# Patient Record
Sex: Male | Born: 1945
Health system: Southern US, Community
[De-identification: ages and names within clinical notes are randomized; demographics above are authoritative.]

## PROBLEM LIST (undated history)

## (undated) DIAGNOSIS — E785 Hyperlipidemia, unspecified: Secondary | ICD-10-CM

## (undated) DIAGNOSIS — I2510 Atherosclerotic heart disease of native coronary artery without angina pectoris: Secondary | ICD-10-CM

## (undated) HISTORY — PX: HERNIA REPAIR: SHX51

## (undated) HISTORY — PX: CARDIAC SURGERY: SHX584

---

## 2001-08-22 HISTORY — PX: CORONARY ANGIOPLASTY WITH STENT PLACEMENT: SHX49

## 2003-09-12 ENCOUNTER — Other Ambulatory Visit: Payer: Self-pay

## 2006-01-30 ENCOUNTER — Emergency Department: Payer: Self-pay | Admitting: Emergency Medicine

## 2006-12-22 ENCOUNTER — Ambulatory Visit: Payer: Self-pay | Admitting: Gastroenterology

## 2009-05-15 ENCOUNTER — Emergency Department: Payer: Self-pay | Admitting: Emergency Medicine

## 2009-05-26 ENCOUNTER — Inpatient Hospital Stay: Payer: Self-pay | Admitting: Cardiology

## 2009-06-11 ENCOUNTER — Ambulatory Visit: Payer: Self-pay | Admitting: Cardiology

## 2009-06-25 ENCOUNTER — Encounter: Payer: Self-pay | Admitting: Cardiology

## 2009-07-22 ENCOUNTER — Encounter: Payer: Self-pay | Admitting: Cardiology

## 2009-08-22 ENCOUNTER — Encounter: Payer: Self-pay | Admitting: Cardiology

## 2010-06-14 ENCOUNTER — Emergency Department: Payer: Self-pay | Admitting: Unknown Physician Specialty

## 2010-07-06 ENCOUNTER — Ambulatory Visit: Payer: Self-pay | Admitting: Cardiology

## 2014-12-09 DIAGNOSIS — R079 Chest pain, unspecified: Secondary | ICD-10-CM | POA: Diagnosis not present

## 2014-12-11 DIAGNOSIS — I2510 Atherosclerotic heart disease of native coronary artery without angina pectoris: Secondary | ICD-10-CM | POA: Diagnosis not present

## 2014-12-11 DIAGNOSIS — I2102 ST elevation (STEMI) myocardial infarction involving left anterior descending coronary artery: Secondary | ICD-10-CM | POA: Diagnosis not present

## 2014-12-11 DIAGNOSIS — R079 Chest pain, unspecified: Secondary | ICD-10-CM | POA: Diagnosis not present

## 2014-12-11 DIAGNOSIS — I10 Essential (primary) hypertension: Secondary | ICD-10-CM | POA: Diagnosis not present

## 2014-12-11 DIAGNOSIS — E780 Pure hypercholesterolemia: Secondary | ICD-10-CM | POA: Diagnosis not present

## 2014-12-11 DIAGNOSIS — Z01818 Encounter for other preprocedural examination: Secondary | ICD-10-CM | POA: Diagnosis not present

## 2014-12-16 ENCOUNTER — Ambulatory Visit
Admit: 2014-12-16 | Disposition: A | Discharge status: Home / Self Care | Payer: Self-pay | Attending: Cardiology | Admitting: Cardiology

## 2014-12-16 DIAGNOSIS — I200 Unstable angina: Secondary | ICD-10-CM | POA: Diagnosis not present

## 2014-12-16 DIAGNOSIS — Z7982 Long term (current) use of aspirin: Secondary | ICD-10-CM | POA: Diagnosis not present

## 2014-12-16 DIAGNOSIS — E785 Hyperlipidemia, unspecified: Secondary | ICD-10-CM | POA: Diagnosis not present

## 2014-12-16 DIAGNOSIS — I25119 Atherosclerotic heart disease of native coronary artery with unspecified angina pectoris: Secondary | ICD-10-CM | POA: Diagnosis not present

## 2014-12-16 DIAGNOSIS — Z79899 Other long term (current) drug therapy: Secondary | ICD-10-CM | POA: Diagnosis not present

## 2014-12-16 DIAGNOSIS — Z8489 Family history of other specified conditions: Secondary | ICD-10-CM | POA: Diagnosis not present

## 2014-12-16 DIAGNOSIS — I25110 Atherosclerotic heart disease of native coronary artery with unstable angina pectoris: Secondary | ICD-10-CM | POA: Diagnosis not present

## 2014-12-16 DIAGNOSIS — R0602 Shortness of breath: Secondary | ICD-10-CM | POA: Diagnosis not present

## 2014-12-16 DIAGNOSIS — I10 Essential (primary) hypertension: Secondary | ICD-10-CM | POA: Diagnosis not present

## 2014-12-16 DIAGNOSIS — I252 Old myocardial infarction: Secondary | ICD-10-CM | POA: Diagnosis not present

## 2014-12-16 DIAGNOSIS — Z87442 Personal history of urinary calculi: Secondary | ICD-10-CM | POA: Diagnosis not present

## 2014-12-16 LAB — CK-MB: CK-MB: 11.6 ng/mL — ABNORMAL HIGH

## 2014-12-17 DIAGNOSIS — E785 Hyperlipidemia, unspecified: Secondary | ICD-10-CM | POA: Diagnosis not present

## 2014-12-17 DIAGNOSIS — I252 Old myocardial infarction: Secondary | ICD-10-CM | POA: Diagnosis not present

## 2014-12-17 DIAGNOSIS — R0602 Shortness of breath: Secondary | ICD-10-CM | POA: Diagnosis not present

## 2014-12-17 DIAGNOSIS — Z7982 Long term (current) use of aspirin: Secondary | ICD-10-CM | POA: Diagnosis not present

## 2014-12-17 DIAGNOSIS — I25119 Atherosclerotic heart disease of native coronary artery with unspecified angina pectoris: Secondary | ICD-10-CM | POA: Diagnosis not present

## 2014-12-17 DIAGNOSIS — Z8489 Family history of other specified conditions: Secondary | ICD-10-CM | POA: Diagnosis not present

## 2014-12-17 DIAGNOSIS — I10 Essential (primary) hypertension: Secondary | ICD-10-CM | POA: Diagnosis not present

## 2014-12-17 DIAGNOSIS — Z79899 Other long term (current) drug therapy: Secondary | ICD-10-CM | POA: Diagnosis not present

## 2014-12-17 DIAGNOSIS — Z87442 Personal history of urinary calculi: Secondary | ICD-10-CM | POA: Diagnosis not present

## 2014-12-17 LAB — TROPONIN I
TROPONIN-I: 1.3 ng/mL — AB
Troponin-I: 0.91 ng/mL — ABNORMAL HIGH

## 2014-12-17 LAB — CK: CK, Total: 192 U/L

## 2014-12-17 LAB — BASIC METABOLIC PANEL
Anion Gap: 6 — ABNORMAL LOW (ref 7–16)
BUN: 20 mg/dL
CALCIUM: 9 mg/dL
CHLORIDE: 107 mmol/L
Co2: 25 mmol/L
Creatinine: 1.22 mg/dL
EGFR (African American): >60
EGFR (Non-African Amer.): 60 — ABNORMAL LOW
GLUCOSE: 138 mg/dL — AB
Potassium: 4 mmol/L
SODIUM: 138 mmol/L

## 2014-12-17 LAB — CK-MB
CK-MB: 11.7 ng/mL — ABNORMAL HIGH
CK-MB: 6.7 ng/mL — ABNORMAL HIGH

## 2014-12-21 NOTE — Discharge Summary (Addendum)
PATIENT NAME:  James Preston, HANDLEY MR#:  [id number] DATE OF BIRTH:  03/22/1946  DATE OF ADMISSION:  12/16/2014 DATE OF DISCHARGE:  12/16/2014  PRIMARY CARE PHYSICIAN:  Derinda Late, MD  FINAL DIAGNOSES:  1. Coronary artery disease.  2. Hypertension.   DISCHARGE MEDICATIONS: Aspirin 81 mg daily, Effient 10 mg daily, metoprolol succinate 50 mg daily, lisinopril tablet 5 mg daily, simvastatin 20 mg daily.   PROCEDURE:  Cardiac catheterization with selective coronary arteriography and percutaneous coronary intervention on 12/16/2014.   HOSPITAL COURSE: The patient underwent elective coronary arteriography on 12/16/2014. Coronary arteriography revealed patent stent proximal LAD, patent stent mid RCA and a high-grade 95% stenosis in mid left circumflex. The patient underwent percutaneous coronary intervention, receiving a 3.5 x 12 mm drug-eluting stent with an excellent angiographic result. The patient was returned to telemetry where he had an uncomplicated hospital course  without chest pain. Postprocedural CPK-MB was 11.6 in the absence of chest pain. The patient has been ambulating without difficulty. Repeat CPK-MB, and troponin is pending. If repeat CPK-MB  is stable or lower and troponin is negative, then we will likely discharge home later today.    ____________________________ Isaias Cowman, MD apt D: 12/17/2014 09:27:34 ET T: 12/17/2014 12:02:09 ET JOB#: [id number]  cc: Isaias Cowman, MD, <Dictator> Isaias Cowman MD ELECTRONICALLY SIGNED 12/20/2014 11:11

## 2014-12-25 DIAGNOSIS — I2510 Atherosclerotic heart disease of native coronary artery without angina pectoris: Secondary | ICD-10-CM | POA: Diagnosis not present

## 2014-12-25 DIAGNOSIS — E780 Pure hypercholesterolemia: Secondary | ICD-10-CM | POA: Diagnosis not present

## 2014-12-25 DIAGNOSIS — I2102 ST elevation (STEMI) myocardial infarction involving left anterior descending coronary artery: Secondary | ICD-10-CM | POA: Diagnosis not present

## 2014-12-25 DIAGNOSIS — I10 Essential (primary) hypertension: Secondary | ICD-10-CM | POA: Diagnosis not present

## 2015-03-31 DIAGNOSIS — E780 Pure hypercholesterolemia: Secondary | ICD-10-CM | POA: Diagnosis not present

## 2015-03-31 DIAGNOSIS — I2102 ST elevation (STEMI) myocardial infarction involving left anterior descending coronary artery: Secondary | ICD-10-CM | POA: Diagnosis not present

## 2015-03-31 DIAGNOSIS — I10 Essential (primary) hypertension: Secondary | ICD-10-CM | POA: Diagnosis not present

## 2015-03-31 DIAGNOSIS — I2510 Atherosclerotic heart disease of native coronary artery without angina pectoris: Secondary | ICD-10-CM | POA: Diagnosis not present

## 2015-06-17 DIAGNOSIS — Z23 Encounter for immunization: Secondary | ICD-10-CM | POA: Diagnosis not present

## 2015-09-11 DIAGNOSIS — K921 Melena: Secondary | ICD-10-CM | POA: Diagnosis not present

## 2015-09-11 DIAGNOSIS — Z79899 Other long term (current) drug therapy: Secondary | ICD-10-CM | POA: Diagnosis not present

## 2015-09-11 DIAGNOSIS — E7800 Pure hypercholesterolemia, unspecified: Secondary | ICD-10-CM | POA: Diagnosis not present

## 2015-09-17 DIAGNOSIS — Z1211 Encounter for screening for malignant neoplasm of colon: Secondary | ICD-10-CM | POA: Diagnosis not present

## 2015-09-17 DIAGNOSIS — Z79899 Other long term (current) drug therapy: Secondary | ICD-10-CM | POA: Diagnosis not present

## 2015-09-17 DIAGNOSIS — Z0000 Encounter for general adult medical examination without abnormal findings: Secondary | ICD-10-CM | POA: Diagnosis not present

## 2015-09-17 DIAGNOSIS — E7800 Pure hypercholesterolemia, unspecified: Secondary | ICD-10-CM | POA: Diagnosis not present

## 2015-10-15 DIAGNOSIS — E7800 Pure hypercholesterolemia, unspecified: Secondary | ICD-10-CM | POA: Diagnosis not present

## 2015-10-15 DIAGNOSIS — I2510 Atherosclerotic heart disease of native coronary artery without angina pectoris: Secondary | ICD-10-CM | POA: Diagnosis not present

## 2015-10-15 DIAGNOSIS — I214 Non-ST elevation (NSTEMI) myocardial infarction: Secondary | ICD-10-CM | POA: Diagnosis not present

## 2015-10-15 DIAGNOSIS — I10 Essential (primary) hypertension: Secondary | ICD-10-CM | POA: Diagnosis not present

## 2015-10-16 DIAGNOSIS — Z1211 Encounter for screening for malignant neoplasm of colon: Secondary | ICD-10-CM | POA: Diagnosis not present

## 2016-04-14 DIAGNOSIS — I2510 Atherosclerotic heart disease of native coronary artery without angina pectoris: Secondary | ICD-10-CM | POA: Diagnosis not present

## 2016-04-14 DIAGNOSIS — I10 Essential (primary) hypertension: Secondary | ICD-10-CM | POA: Diagnosis not present

## 2016-04-14 DIAGNOSIS — I2109 ST elevation (STEMI) myocardial infarction involving other coronary artery of anterior wall: Secondary | ICD-10-CM | POA: Diagnosis not present

## 2016-04-14 DIAGNOSIS — E7800 Pure hypercholesterolemia, unspecified: Secondary | ICD-10-CM | POA: Diagnosis not present

## 2016-06-01 DIAGNOSIS — Z23 Encounter for immunization: Secondary | ICD-10-CM | POA: Diagnosis not present

## 2016-06-20 DIAGNOSIS — G44309 Post-traumatic headache, unspecified, not intractable: Secondary | ICD-10-CM | POA: Diagnosis not present

## 2016-06-20 DIAGNOSIS — H532 Diplopia: Secondary | ICD-10-CM | POA: Diagnosis not present

## 2016-06-23 ENCOUNTER — Other Ambulatory Visit: Payer: Self-pay | Admitting: Ophthalmology

## 2016-06-23 DIAGNOSIS — H4922 Sixth [abducent] nerve palsy, left eye: Secondary | ICD-10-CM | POA: Diagnosis not present

## 2016-06-23 DIAGNOSIS — H4920 Sixth [abducent] nerve palsy, unspecified eye: Secondary | ICD-10-CM

## 2016-06-23 DIAGNOSIS — H532 Diplopia: Secondary | ICD-10-CM

## 2016-06-24 ENCOUNTER — Ambulatory Visit
Admission: RE | Admit: 2016-06-24 | Discharge: 2016-06-24 | Disposition: A | Discharge status: Home / Self Care | Payer: Commercial Managed Care - HMO | Source: Ambulatory Visit | Attending: Ophthalmology | Admitting: Ophthalmology

## 2016-06-24 DIAGNOSIS — H4920 Sixth [abducent] nerve palsy, unspecified eye: Secondary | ICD-10-CM | POA: Insufficient documentation

## 2016-06-24 DIAGNOSIS — H532 Diplopia: Secondary | ICD-10-CM

## 2016-06-24 LAB — POCT I-STAT CREATININE: Creatinine, Ser: 1.4 mg/dL — ABNORMAL HIGH (ref 0.61–1.24)

## 2016-06-24 MED ORDER — GADOBENATE DIMEGLUMINE 529 MG/ML IV SOLN
20.0000 mL | Freq: Once | INTRAVENOUS | Status: AC | PRN
  Administered 2016-06-24: 18 mL via INTRAVENOUS

## 2016-06-27 ENCOUNTER — Other Ambulatory Visit
Admit: 2016-06-27 | Discharge: 2016-06-27 | Disposition: A | Discharge status: Home / Self Care | Payer: Commercial Managed Care - HMO | Attending: Ophthalmology | Admitting: Ophthalmology

## 2016-06-27 DIAGNOSIS — H4922 Sixth [abducent] nerve palsy, left eye: Secondary | ICD-10-CM | POA: Insufficient documentation

## 2016-06-27 LAB — PLATELET COUNT: Platelets: 152 10*3/uL (ref 150–440)

## 2016-06-27 LAB — SEDIMENTATION RATE: Sed Rate: 9 mm/hr (ref 0–20)

## 2016-06-28 LAB — HIGH SENSITIVITY CRP: CRP, High Sensitivity: 1.74 mg/L (ref 0.00–3.00)

## 2016-07-01 LAB — MISC LABCORP TEST (SEND OUT): Labcorp test code: 85933

## 2016-09-13 DIAGNOSIS — Z125 Encounter for screening for malignant neoplasm of prostate: Secondary | ICD-10-CM | POA: Diagnosis not present

## 2016-09-13 DIAGNOSIS — E7800 Pure hypercholesterolemia, unspecified: Secondary | ICD-10-CM | POA: Diagnosis not present

## 2016-09-13 DIAGNOSIS — Z79899 Other long term (current) drug therapy: Secondary | ICD-10-CM | POA: Diagnosis not present

## 2016-09-19 DIAGNOSIS — Z0000 Encounter for general adult medical examination without abnormal findings: Secondary | ICD-10-CM | POA: Diagnosis not present

## 2016-10-04 DIAGNOSIS — I2510 Atherosclerotic heart disease of native coronary artery without angina pectoris: Secondary | ICD-10-CM | POA: Diagnosis not present

## 2016-10-04 DIAGNOSIS — I10 Essential (primary) hypertension: Secondary | ICD-10-CM | POA: Diagnosis not present

## 2016-10-04 DIAGNOSIS — I2109 ST elevation (STEMI) myocardial infarction involving other coronary artery of anterior wall: Secondary | ICD-10-CM | POA: Diagnosis not present

## 2016-10-04 DIAGNOSIS — E7800 Pure hypercholesterolemia, unspecified: Secondary | ICD-10-CM | POA: Diagnosis not present

## 2016-10-27 DIAGNOSIS — I2510 Atherosclerotic heart disease of native coronary artery without angina pectoris: Secondary | ICD-10-CM | POA: Diagnosis not present

## 2016-10-27 DIAGNOSIS — I2109 ST elevation (STEMI) myocardial infarction involving other coronary artery of anterior wall: Secondary | ICD-10-CM | POA: Diagnosis not present

## 2016-11-01 DIAGNOSIS — I2510 Atherosclerotic heart disease of native coronary artery without angina pectoris: Secondary | ICD-10-CM | POA: Diagnosis not present

## 2016-11-01 DIAGNOSIS — I2109 ST elevation (STEMI) myocardial infarction involving other coronary artery of anterior wall: Secondary | ICD-10-CM | POA: Diagnosis not present

## 2016-11-01 DIAGNOSIS — I10 Essential (primary) hypertension: Secondary | ICD-10-CM | POA: Diagnosis not present

## 2016-11-01 DIAGNOSIS — E7800 Pure hypercholesterolemia, unspecified: Secondary | ICD-10-CM | POA: Diagnosis not present

## 2017-05-02 DIAGNOSIS — I2510 Atherosclerotic heart disease of native coronary artery without angina pectoris: Secondary | ICD-10-CM | POA: Diagnosis not present

## 2017-05-02 DIAGNOSIS — I10 Essential (primary) hypertension: Secondary | ICD-10-CM | POA: Diagnosis not present

## 2017-05-02 DIAGNOSIS — E785 Hyperlipidemia, unspecified: Secondary | ICD-10-CM | POA: Diagnosis not present

## 2017-05-31 DIAGNOSIS — Z23 Encounter for immunization: Secondary | ICD-10-CM | POA: Diagnosis not present

## 2017-06-16 DIAGNOSIS — M25551 Pain in right hip: Secondary | ICD-10-CM | POA: Diagnosis not present

## 2017-10-24 DIAGNOSIS — Z79899 Other long term (current) drug therapy: Secondary | ICD-10-CM | POA: Diagnosis not present

## 2017-10-24 DIAGNOSIS — Z125 Encounter for screening for malignant neoplasm of prostate: Secondary | ICD-10-CM | POA: Diagnosis not present

## 2017-10-24 DIAGNOSIS — E7800 Pure hypercholesterolemia, unspecified: Secondary | ICD-10-CM | POA: Diagnosis not present

## 2017-10-31 DIAGNOSIS — I2109 ST elevation (STEMI) myocardial infarction involving other coronary artery of anterior wall: Secondary | ICD-10-CM | POA: Diagnosis not present

## 2017-10-31 DIAGNOSIS — I2510 Atherosclerotic heart disease of native coronary artery without angina pectoris: Secondary | ICD-10-CM | POA: Diagnosis not present

## 2017-10-31 DIAGNOSIS — I10 Essential (primary) hypertension: Secondary | ICD-10-CM | POA: Diagnosis not present

## 2017-10-31 DIAGNOSIS — E785 Hyperlipidemia, unspecified: Secondary | ICD-10-CM | POA: Diagnosis not present

## 2017-11-01 DIAGNOSIS — H524 Presbyopia: Secondary | ICD-10-CM | POA: Diagnosis not present

## 2017-11-01 DIAGNOSIS — Z0100 Encounter for examination of eyes and vision without abnormal findings: Secondary | ICD-10-CM | POA: Diagnosis not present

## 2017-11-02 DIAGNOSIS — Z0000 Encounter for general adult medical examination without abnormal findings: Secondary | ICD-10-CM | POA: Diagnosis not present

## 2018-05-07 DIAGNOSIS — R739 Hyperglycemia, unspecified: Secondary | ICD-10-CM | POA: Diagnosis not present

## 2018-05-07 DIAGNOSIS — E7800 Pure hypercholesterolemia, unspecified: Secondary | ICD-10-CM | POA: Diagnosis not present

## 2018-05-07 DIAGNOSIS — Z79899 Other long term (current) drug therapy: Secondary | ICD-10-CM | POA: Diagnosis not present

## 2018-05-08 DIAGNOSIS — I2510 Atherosclerotic heart disease of native coronary artery without angina pectoris: Secondary | ICD-10-CM | POA: Diagnosis not present

## 2018-05-08 DIAGNOSIS — I2109 ST elevation (STEMI) myocardial infarction involving other coronary artery of anterior wall: Secondary | ICD-10-CM | POA: Diagnosis not present

## 2018-05-08 DIAGNOSIS — E785 Hyperlipidemia, unspecified: Secondary | ICD-10-CM | POA: Diagnosis not present

## 2018-05-08 DIAGNOSIS — I10 Essential (primary) hypertension: Secondary | ICD-10-CM | POA: Diagnosis not present

## 2018-05-14 DIAGNOSIS — E119 Type 2 diabetes mellitus without complications: Secondary | ICD-10-CM | POA: Diagnosis not present

## 2018-05-14 DIAGNOSIS — Z125 Encounter for screening for malignant neoplasm of prostate: Secondary | ICD-10-CM | POA: Diagnosis not present

## 2018-05-14 DIAGNOSIS — E7800 Pure hypercholesterolemia, unspecified: Secondary | ICD-10-CM | POA: Diagnosis not present

## 2018-05-14 DIAGNOSIS — I2510 Atherosclerotic heart disease of native coronary artery without angina pectoris: Secondary | ICD-10-CM | POA: Diagnosis not present

## 2018-05-14 DIAGNOSIS — Z79899 Other long term (current) drug therapy: Secondary | ICD-10-CM | POA: Diagnosis not present

## 2018-05-14 DIAGNOSIS — I10 Essential (primary) hypertension: Secondary | ICD-10-CM | POA: Diagnosis not present

## 2018-05-30 DIAGNOSIS — Z23 Encounter for immunization: Secondary | ICD-10-CM | POA: Diagnosis not present

## 2019-03-22 ENCOUNTER — Other Ambulatory Visit: Payer: Commercial Managed Care - HMO | Attending: Internal Medicine

## 2019-03-27 ENCOUNTER — Ambulatory Visit: Admit: 2019-03-27 | Payer: Commercial Managed Care - HMO | Admitting: Internal Medicine

## 2019-03-27 SURGERY — COLONOSCOPY WITH PROPOFOL
Anesthesia: General

## 2019-06-11 ENCOUNTER — Other Ambulatory Visit: Payer: Self-pay | Admitting: *Deleted

## 2019-06-11 DIAGNOSIS — Z20822 Contact with and (suspected) exposure to covid-19: Secondary | ICD-10-CM

## 2019-06-12 LAB — NOVEL CORONAVIRUS, NAA: SARS-CoV-2, NAA: NOT DETECTED

## 2020-04-20 DIAGNOSIS — M5416 Radiculopathy, lumbar region: Secondary | ICD-10-CM | POA: Diagnosis not present

## 2020-04-20 DIAGNOSIS — M5126 Other intervertebral disc displacement, lumbar region: Secondary | ICD-10-CM | POA: Diagnosis not present

## 2020-06-25 DIAGNOSIS — E1122 Type 2 diabetes mellitus with diabetic chronic kidney disease: Secondary | ICD-10-CM | POA: Diagnosis not present

## 2020-06-25 DIAGNOSIS — E7800 Pure hypercholesterolemia, unspecified: Secondary | ICD-10-CM | POA: Diagnosis not present

## 2020-06-25 DIAGNOSIS — N1831 Chronic kidney disease, stage 3a: Secondary | ICD-10-CM | POA: Diagnosis not present

## 2020-06-25 DIAGNOSIS — Z79899 Other long term (current) drug therapy: Secondary | ICD-10-CM | POA: Diagnosis not present

## 2020-07-02 DIAGNOSIS — Z125 Encounter for screening for malignant neoplasm of prostate: Secondary | ICD-10-CM | POA: Diagnosis not present

## 2020-07-02 DIAGNOSIS — E1122 Type 2 diabetes mellitus with diabetic chronic kidney disease: Secondary | ICD-10-CM | POA: Diagnosis not present

## 2020-07-02 DIAGNOSIS — E785 Hyperlipidemia, unspecified: Secondary | ICD-10-CM | POA: Diagnosis not present

## 2020-07-02 DIAGNOSIS — Z23 Encounter for immunization: Secondary | ICD-10-CM | POA: Diagnosis not present

## 2020-07-02 DIAGNOSIS — N183 Chronic kidney disease, stage 3 unspecified: Secondary | ICD-10-CM | POA: Diagnosis not present

## 2020-07-02 DIAGNOSIS — Z79899 Other long term (current) drug therapy: Secondary | ICD-10-CM | POA: Diagnosis not present

## 2020-07-02 DIAGNOSIS — I2510 Atherosclerotic heart disease of native coronary artery without angina pectoris: Secondary | ICD-10-CM | POA: Diagnosis not present

## 2020-07-02 DIAGNOSIS — I129 Hypertensive chronic kidney disease with stage 1 through stage 4 chronic kidney disease, or unspecified chronic kidney disease: Secondary | ICD-10-CM | POA: Diagnosis not present

## 2020-08-18 DIAGNOSIS — I10 Essential (primary) hypertension: Secondary | ICD-10-CM | POA: Diagnosis not present

## 2020-08-18 DIAGNOSIS — E7800 Pure hypercholesterolemia, unspecified: Secondary | ICD-10-CM | POA: Diagnosis not present

## 2020-08-18 DIAGNOSIS — I2510 Atherosclerotic heart disease of native coronary artery without angina pectoris: Secondary | ICD-10-CM | POA: Diagnosis not present

## 2020-12-28 DIAGNOSIS — Z125 Encounter for screening for malignant neoplasm of prostate: Secondary | ICD-10-CM | POA: Diagnosis not present

## 2020-12-28 DIAGNOSIS — E1122 Type 2 diabetes mellitus with diabetic chronic kidney disease: Secondary | ICD-10-CM | POA: Diagnosis not present

## 2020-12-28 DIAGNOSIS — Z79899 Other long term (current) drug therapy: Secondary | ICD-10-CM | POA: Diagnosis not present

## 2020-12-28 DIAGNOSIS — N1831 Chronic kidney disease, stage 3a: Secondary | ICD-10-CM | POA: Diagnosis not present

## 2020-12-28 DIAGNOSIS — E7800 Pure hypercholesterolemia, unspecified: Secondary | ICD-10-CM | POA: Diagnosis not present

## 2021-01-04 DIAGNOSIS — Z0000 Encounter for general adult medical examination without abnormal findings: Secondary | ICD-10-CM | POA: Diagnosis not present

## 2021-01-04 DIAGNOSIS — N183 Chronic kidney disease, stage 3 unspecified: Secondary | ICD-10-CM | POA: Diagnosis not present

## 2021-01-04 DIAGNOSIS — Z1211 Encounter for screening for malignant neoplasm of colon: Secondary | ICD-10-CM | POA: Diagnosis not present

## 2021-01-04 DIAGNOSIS — Z1331 Encounter for screening for depression: Secondary | ICD-10-CM | POA: Diagnosis not present

## 2021-01-04 DIAGNOSIS — Z79899 Other long term (current) drug therapy: Secondary | ICD-10-CM | POA: Diagnosis not present

## 2021-01-28 DIAGNOSIS — Z1211 Encounter for screening for malignant neoplasm of colon: Secondary | ICD-10-CM | POA: Diagnosis not present

## 2021-02-01 ENCOUNTER — Other Ambulatory Visit (HOSPITAL_COMMUNITY): Payer: Self-pay

## 2021-02-17 DIAGNOSIS — I2510 Atherosclerotic heart disease of native coronary artery without angina pectoris: Secondary | ICD-10-CM | POA: Diagnosis not present

## 2021-02-17 DIAGNOSIS — E7800 Pure hypercholesterolemia, unspecified: Secondary | ICD-10-CM | POA: Diagnosis not present

## 2021-02-17 DIAGNOSIS — I10 Essential (primary) hypertension: Secondary | ICD-10-CM | POA: Diagnosis not present

## 2021-02-17 DIAGNOSIS — E1122 Type 2 diabetes mellitus with diabetic chronic kidney disease: Secondary | ICD-10-CM | POA: Diagnosis not present

## 2021-02-17 DIAGNOSIS — N1831 Chronic kidney disease, stage 3a: Secondary | ICD-10-CM | POA: Diagnosis not present

## 2021-03-01 DIAGNOSIS — H524 Presbyopia: Secondary | ICD-10-CM | POA: Diagnosis not present

## 2021-03-23 DIAGNOSIS — H25813 Combined forms of age-related cataract, bilateral: Secondary | ICD-10-CM | POA: Diagnosis not present

## 2021-03-23 DIAGNOSIS — H02834 Dermatochalasis of left upper eyelid: Secondary | ICD-10-CM | POA: Diagnosis not present

## 2021-03-23 DIAGNOSIS — H02831 Dermatochalasis of right upper eyelid: Secondary | ICD-10-CM | POA: Diagnosis not present

## 2021-03-26 DIAGNOSIS — R195 Other fecal abnormalities: Secondary | ICD-10-CM | POA: Diagnosis not present

## 2021-03-26 DIAGNOSIS — R194 Change in bowel habit: Secondary | ICD-10-CM | POA: Diagnosis not present

## 2021-03-29 ENCOUNTER — Emergency Department: Payer: Medicare HMO

## 2021-03-29 ENCOUNTER — Observation Stay
Admission: EM | Admit: 2021-03-29 | Discharge: 2021-03-31 | Disposition: A | Discharge status: Home / Self Care | Payer: Medicare HMO | Attending: Internal Medicine | Admitting: Internal Medicine

## 2021-03-29 DIAGNOSIS — N1831 Chronic kidney disease, stage 3a: Secondary | ICD-10-CM

## 2021-03-29 DIAGNOSIS — R001 Bradycardia, unspecified: Secondary | ICD-10-CM | POA: Diagnosis not present

## 2021-03-29 DIAGNOSIS — R531 Weakness: Secondary | ICD-10-CM | POA: Diagnosis present

## 2021-03-29 DIAGNOSIS — G459 Transient cerebral ischemic attack, unspecified: Secondary | ICD-10-CM | POA: Diagnosis not present

## 2021-03-29 DIAGNOSIS — Z7982 Long term (current) use of aspirin: Secondary | ICD-10-CM | POA: Insufficient documentation

## 2021-03-29 DIAGNOSIS — R0689 Other abnormalities of breathing: Secondary | ICD-10-CM | POA: Diagnosis not present

## 2021-03-29 DIAGNOSIS — R7303 Prediabetes: Secondary | ICD-10-CM

## 2021-03-29 DIAGNOSIS — Z79899 Other long term (current) drug therapy: Secondary | ICD-10-CM | POA: Diagnosis not present

## 2021-03-29 DIAGNOSIS — Z7902 Long term (current) use of antithrombotics/antiplatelets: Secondary | ICD-10-CM | POA: Diagnosis not present

## 2021-03-29 DIAGNOSIS — E785 Hyperlipidemia, unspecified: Secondary | ICD-10-CM | POA: Diagnosis not present

## 2021-03-29 DIAGNOSIS — I447 Left bundle-branch block, unspecified: Secondary | ICD-10-CM | POA: Diagnosis not present

## 2021-03-29 DIAGNOSIS — Z20822 Contact with and (suspected) exposure to covid-19: Secondary | ICD-10-CM | POA: Diagnosis not present

## 2021-03-29 DIAGNOSIS — R4781 Slurred speech: Secondary | ICD-10-CM | POA: Diagnosis not present

## 2021-03-29 DIAGNOSIS — N183 Chronic kidney disease, stage 3 unspecified: Secondary | ICD-10-CM | POA: Diagnosis present

## 2021-03-29 DIAGNOSIS — R2689 Other abnormalities of gait and mobility: Secondary | ICD-10-CM | POA: Diagnosis not present

## 2021-03-29 DIAGNOSIS — R202 Paresthesia of skin: Secondary | ICD-10-CM | POA: Diagnosis not present

## 2021-03-29 DIAGNOSIS — I2510 Atherosclerotic heart disease of native coronary artery without angina pectoris: Secondary | ICD-10-CM

## 2021-03-29 DIAGNOSIS — R0902 Hypoxemia: Secondary | ICD-10-CM | POA: Diagnosis not present

## 2021-03-29 HISTORY — DX: Hyperlipidemia, unspecified: E78.5

## 2021-03-29 HISTORY — DX: Atherosclerotic heart disease of native coronary artery without angina pectoris: I25.10

## 2021-03-29 LAB — RESP PANEL BY RT-PCR (FLU A&B, COVID) ARPGX2
Influenza A by PCR: NEGATIVE
Influenza B by PCR: NEGATIVE
SARS Coronavirus 2 by RT PCR: NEGATIVE

## 2021-03-29 LAB — CBC WITH DIFFERENTIAL/PLATELET
Abs Immature Granulocytes: 0.03 10*3/uL (ref 0.00–0.07)
Basophils Absolute: 0 10*3/uL (ref 0.0–0.1)
Basophils Relative: 0 %
Eosinophils Absolute: 0.3 10*3/uL (ref 0.0–0.5)
Eosinophils Relative: 4 %
HCT: 34.5 % — ABNORMAL LOW (ref 39.0–52.0)
Hemoglobin: 11.9 g/dL — ABNORMAL LOW (ref 13.0–17.0)
Immature Granulocytes: 0 %
Lymphocytes Relative: 23 %
Lymphs Abs: 1.7 10*3/uL (ref 0.7–4.0)
MCH: 30.5 pg (ref 26.0–34.0)
MCHC: 34.5 g/dL (ref 30.0–36.0)
MCV: 88.5 fL (ref 80.0–100.0)
Monocytes Absolute: 0.7 10*3/uL (ref 0.1–1.0)
Monocytes Relative: 10 %
Neutro Abs: 4.7 10*3/uL (ref 1.7–7.7)
Neutrophils Relative %: 63 %
Platelets: 154 10*3/uL (ref 150–400)
RBC: 3.9 MIL/uL — ABNORMAL LOW (ref 4.22–5.81)
RDW: 12.9 % (ref 11.5–15.5)
WBC: 7.5 10*3/uL (ref 4.0–10.5)
nRBC: 0 % (ref 0.0–0.2)

## 2021-03-29 LAB — MAGNESIUM: Magnesium: 2.3 mg/dL (ref 1.7–2.4)

## 2021-03-29 LAB — BASIC METABOLIC PANEL
Anion gap: 4 — ABNORMAL LOW (ref 5–15)
BUN: 36 mg/dL — ABNORMAL HIGH (ref 8–23)
CO2: 26 mmol/L (ref 22–32)
Calcium: 9 mg/dL (ref 8.9–10.3)
Chloride: 105 mmol/L (ref 98–111)
Creatinine, Ser: 1.73 mg/dL — ABNORMAL HIGH (ref 0.61–1.24)
GFR, Estimated: 40 mL/min — ABNORMAL LOW (ref >60)
Glucose, Bld: 106 mg/dL — ABNORMAL HIGH (ref 70–99)
Potassium: 4.2 mmol/L (ref 3.5–5.1)
Sodium: 135 mmol/L (ref 135–145)

## 2021-03-29 LAB — TROPONIN I (HIGH SENSITIVITY): Troponin I (High Sensitivity): 8 ng/L (ref <18)

## 2021-03-29 MED ORDER — STROKE: EARLY STAGES OF RECOVERY BOOK: Freq: Once | Status: AC

## 2021-03-29 MED ORDER — ACETAMINOPHEN 325 MG PO TABS: 650.0000 mg | ORAL_TABLET | Freq: Four times a day (QID) | ORAL | Status: DC | PRN

## 2021-03-29 MED ORDER — ACETAMINOPHEN 650 MG RE SUPP: 650.0000 mg | Freq: Four times a day (QID) | RECTAL | Status: DC | PRN

## 2021-03-29 NOTE — ED Provider Notes (Signed)
[facility] Emergency Department Provider Note   ____________________________________________   I have reviewed the triage vital signs and the nursing notes.   HISTORY  Chief Complaint Numbness   History limited by: Not Limited   HPI James Preston is a 75 y.o. male who presents to the emergency department today because of concern for an episode of left arm weakness/paresthesias. The patient states that at around 6 he noticed that his arm felt like it had gone to sleep. He noticed it when he was having a hard time putting the lid back on a jar. By the time of my exam the patient states that it was feeling better. 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 Denies any head trauma. The patient also states that he noticed some slurred speech which has also resolved. The patient denies similar symptoms in the past.   Records reviewed. Per medical record review patient has a history of CAD with PCI.   Past Medical History:  Diagnosis Date   Coronary artery disease     There are no problems to display for this patient.   Past Surgical History:  Procedure Laterality Date   CARDIAC SURGERY     CORONARY ANGIOPLASTY WITH STENT PLACEMENT  2003   x3    Prior to Admission medications   Not on File    Allergies Patient has no known allergies.  History reviewed. No pertinent family history.  Social History Social History   Tobacco Use   Smoking status: Never   Smokeless tobacco: Never  Substance Use Topics   Alcohol use: Yes    Comment: very rare   Drug use: Never    Review of Systems Constitutional: No fever/chills Eyes: No visual changes. ENT: No sore throat. Cardiovascular: Denies chest pain. Respiratory: Denies shortness of breath. Gastrointestinal: No abdominal pain.  No nausea, no vomiting.  No diarrhea.   Genitourinary: Negative for dysuria. Musculoskeletal: Negative for back  pain. Skin: Negative for rash. Neurological: Positive for slurred speech. Positive for left arm paresthesias.   ____________________________________________   PHYSICAL EXAM:  VITAL SIGNS: ED Triage Vitals  Enc Vitals Group     BP 03/29/21 1839 138/75     Pulse Rate 03/29/21 1839 (!) 52     Resp 03/29/21 1839 13     Temp 03/29/21 1839 98.5 F (36.9 C)     Temp Source 03/29/21 1839 Oral     SpO2 03/29/21 1839 100 %     Weight 03/29/21 1842 185 lb (83.9 kg)     Height 03/29/21 1842 '5\' 7"'$  (1.702 m)     Head Circumference --      Peak Flow --      Pain Score 03/29/21 1841 0   Constitutional: Alert and oriented.  Eyes: Conjunctivae are normal.  ENT      Head: Normocephalic and atraumatic.      Nose: No congestion/rhinnorhea.      Mouth/Throat: Mucous membranes are moist.      Neck: No stridor. Hematological/Lymphatic/Immunilogical: No cervical lymphadenopathy. Cardiovascular: Normal rate, regular rhythm.  No murmurs, rubs, or gallops.  Respiratory: Normal respiratory effort without tachypnea nor retractions. Breath sounds are clear and equal bilaterally. No wheezes/rales/rhonchi. Gastrointestinal: Soft and non tender. No rebound. No guarding.  Genitourinary: Deferred Musculoskeletal: Normal range of motion in all extremities. No lower extremity edema. Neurologic:  Normal speech and language. Face symmetric. PERRL. EOMI. Strength 5/5 in upper and lower extremities.  Sensation intact. No gross focal neurologic deficits are appreciated.  Skin:  Skin is warm, dry and intact. No rash noted. Psychiatric: Mood and affect are normal. Speech and behavior are normal. Patient exhibits appropriate insight and judgment.  ____________________________________________    LABS (pertinent positives/negatives)  CBC wbc 7.5, hgb 11.9, plt 154 Trop hs 8 BMP na 135, k 4.2, glu 106, cr 1.73 ____________________________________________   EKG  I, Nance Pear, attending physician,  personally viewed and interpreted this EKG  EKG Time: 1844 Rate: 52 Rhythm: sinus bradycardia Axis: normal Intervals: qtc 423 QRS: LBBB ST changes: no st elevation Impression: abnormal ekg  ____________________________________________    RADIOLOGY  CT head No acute abnormality  ____________________________________________   PROCEDURES  Procedures  ____________________________________________   INITIAL IMPRESSION / ASSESSMENT AND PLAN / ED COURSE  Pertinent labs & imaging results that were available during my care of the patient were reviewed by me and considered in my medical decision making (see chart for details).   Patient presented to the emergency department today because of concern for left arm going asleep and some slurred speech. At the time of my exam the patient states he feels improved and neuro exam is unremarkable. CT head and blood work without obvious etiology. At this time will plan on admission for TIA work up. Discussed plan with patient.   ____________________________________________   FINAL CLINICAL IMPRESSION(S) / ED DIAGNOSES  Final diagnoses:  TIA (transient ischemic attack)     Note: This dictation was prepared with Dragon dictation. Any transcriptional errors that result from this process are unintentional     Nance Pear, MD 03/30/21 [phone]

## 2021-03-29 NOTE — H&P (Signed)
History and Physical    PLEASE NOTE THAT DRAGON DICTATION SOFTWARE WAS USED IN THE CONSTRUCTION OF THIS NOTE.   James Preston [id number] DOB: 1946-04-29 DOA: 03/29/2021  PCP: Derinda Late, MD Patient coming from: home   I have personally briefly reviewed patient's old medical records in [place]  Chief Complaint: Slurred speech  HPI: James Preston is a 75 y.o. male with medical history significant for coronary artery disease status post PCI with stents x3 in 2003, hyperlipidemia, stage IIIa chronic kidney disease with baseline creatinine 1.5, who is admitted to [facility] on 03/29/2021 with suspected TIA after presenting from home to [facility] ED complaining of slurred speech.   The patient reports sudden onset of slurred speech and left hand weakness associated with numbness at 1730 today.  He reports that these symptoms lasted approximately 1 hour before spontaneously resolving without subsequent recurrence.  Denies any residual acute focal neurologic deficits at this time.  He reports that the above symptoms were not associated with any dysphagia, dizziness, vertigo, nausea, vomiting, acute change in vision, blurry vision, diplopia, facial droop, or headache.  The patient was with his wife at the time of onset of the above symptoms, he was able to confirm the presence of slurred speech at that time.  Denies any associated chest pain, shortness of breath, palpitations, diaphoresis, presyncope, or syncope.  Denies any previous history of stroke. In terms of modifiable risk factors, the patient acknowledges a history of hyperlipidemia, but denies any known underlying history of hypertension, reporting that beta-blocker and ACE inhibitor that he takes as an outpatient is on the basis of a purely cardiac indication stemming from a history of coronary artery disease.  Denies any known history of underlying diabetes, atrial fibrillation, or obstructive sleep  apnea.  Reports that he is a lifelong non-smoker.  Reports good compliance with his outpatient antihypertensive medications as well as simvastatin 40 mg p.o. daily.  He is on a daily baby aspirin, with most recent dose occurring on the morning of 03/29/2021, and denies any use of additional antiplatelet or anticoagulant medications as an outpatient.  He was brought to [facility] ED for further evaluation and management of the above.  Of note, his initial slurred speech, left hand weakness, and left hand numbness all completely resolved and spontaneous fashion prior to being seen by the EDP.   Denies any associated subjective fever, chills, rigors, or generalized myalgias.  No recent neck stiffness, rhinitis, rhinorrhea, sore throat, wheezing, cough, abdominal pain, or rash.  No recent traveling or known COVID-19 exposures.  Denies any recent dysuria, gross hematuria, or change in urinary urgency/frequency.    ED Course:  Vital signs in the ED were notable for the following:  - Tetramex 98.5, heart rate 59, blood pressure 123/75 138/75; respiratory rate 16-20, oxygen saturation 97 to 100% on room air.  Labs were notable for the following: BMP was notable for the following: Sodium 135, potassium 4.2, BUN 36, creatinine 1.73 relative to most recent prior creatinine data point 1.5159 2022.  High-sensitivity troponin I noted to be 8.  CBC notable for white cell count of 7500.  Screening nasopharyngeal COVID-19 PCR was performed in the ED this evening and found to be negative.  Noncontrast CT head showed no evidence of acute intracranial process, including no evidence of intracranial hemorrhage or acute infarct.  While in the ED, the following were administered: none.  Subsequently, the patient was admitted to the med telemetry floor for overnight  observation for further evaluation and management of suspected presenting TIA.     Review of Systems: As per HPI otherwise 10 point review of systems negative.    Past Medical History:  Diagnosis Date   Coronary artery disease    HLD (hyperlipidemia)     Past Surgical History:  Procedure Laterality Date   CARDIAC SURGERY     CORONARY ANGIOPLASTY WITH STENT PLACEMENT  2003   x3    Social History:  reports that he has never smoked. He has never used smokeless tobacco. He reports current alcohol use. He reports that he does not use drugs.   No Known Allergies  History reviewed. No pertinent family history.   Home medications: Daily baby aspirin, lisinopril, Toprol-XL, simvastatin 40 mg p.o. daily.  Objective    Physical Exam: Vitals:   03/29/21 1930 03/29/21 2000 03/29/21 2030 03/29/21 2100  BP: 123/75 129/64 124/67 130/69  Pulse: (!) 51 (!) 48 (!) 53 (!) 47  Resp: (!) '23 20 17 19  '$ Temp:      TempSrc:      SpO2: 98% 98% 98% 99%  Weight:      Height:        General: appears to be stated age; alert, oriented Skin: warm, dry, no rash Head:  AT/Harris Mouth:  Oral mucosa membranes appear moist, normal dentition Neck: supple; trachea midline Heart:  RRR; did not appreciate any M/R/G Lungs: CTAB, did not appreciate any wheezes, rales, or rhonchi Abdomen: + BS; soft, ND, NT Vascular: 2+ pedal pulses b/l; 2+ radial pulses b/l Extremities: no peripheral edema, no muscle wasting Neuro:  5/5 strength of the proximal and distal flexors and extensors of the upper and lower extremities bilaterally; sensation intact in upper and lower extremities b/l; cranial nerves II through XII grossly intact; no pronator drift; no evidence suggestive of slurred speech, dysarthria, or facial droop; Normal muscle tone. No tremors.    Labs on Admission: I have personally reviewed following labs and imaging studies  CBC: Recent Labs  Lab 03/29/21 1845  WBC 7.5  NEUTROABS 4.7  HGB 11.9*  HCT 34.5*  MCV 88.5  PLT 123456   Basic Metabolic Panel: Recent Labs  Lab 03/29/21 1845  NA 135  K 4.2  CL 105  CO2 26  GLUCOSE 106*  BUN 36*  CREATININE  1.73*  CALCIUM 9.0   GFR: Estimated Creatinine Clearance: 37 mL/min (A) (by C-G formula based on SCr of 1.73 mg/dL (H)). Liver Function Tests: No results for input(s): AST, ALT, ALKPHOS, BILITOT, PROT, ALBUMIN in the last 168 hours. No results for input(s): LIPASE, AMYLASE in the last 168 hours. No results for input(s): AMMONIA in the last 168 hours. Coagulation Profile: No results for input(s): INR, PROTIME in the last 168 hours. Cardiac Enzymes: No results for input(s): CKTOTAL, CKMB, CKMBINDEX, TROPONINI in the last 168 hours. BNP (last 3 results) No results for input(s): PROBNP in the last 8760 hours. HbA1C: No results for input(s): HGBA1C in the last 72 hours. CBG: No results for input(s): GLUCAP in the last 168 hours. Lipid Profile: No results for input(s): CHOL, HDL, LDLCALC, TRIG, CHOLHDL, LDLDIRECT in the last 72 hours. Thyroid Function Tests: No results for input(s): TSH, T4TOTAL, FREET4, T3FREE, THYROIDAB in the last 72 hours. Anemia Panel: No results for input(s): VITAMINB12, FOLATE, FERRITIN, TIBC, IRON, RETICCTPCT in the last 72 hours. Urine analysis: No results found for: COLORURINE, APPEARANCEUR, LABSPEC, PHURINE, GLUCOSEU, HGBUR, BILIRUBINUR, KETONESUR, PROTEINUR, UROBILINOGEN, NITRITE, LEUKOCYTESUR  Radiological Exams on Admission:  CT HEAD WO CONTRAST (5MM)  Result Date: 03/29/2021 CLINICAL DATA:  Left arm numbness.  Slurred speech. EXAM: CT HEAD WITHOUT CONTRAST TECHNIQUE: Contiguous axial images were obtained from the base of the skull through the vertex without intravenous contrast. COMPARISON:  Brain MRI 06/24/2016 FINDINGS: Brain: No intracranial hemorrhage, mass effect, or midline shift. No hydrocephalus. The basilar cisterns are patent. No evidence of territorial infarct or acute ischemia. Minimal periventricular chronic small vessel ischemia, similar to prior MRI. No extra-axial or intracranial fluid collection. Vascular: No hyperdense vessel or unexpected  calcification. Skull: Normal. Negative for fracture or focal lesion. Sinuses/Orbits: Minimal opacification of lower right mastoid air cells. Mastoid air cells otherwise clear. Paranasal sinuses are clear. Other: None. IMPRESSION: 1. No acute intracranial abnormality. 2. Minimal opacification of lower right mastoid air cells. Electronically Signed   By: Keith Rake M.D.   On: 03/29/2021 19:54      Assessment/Plan   James Preston is a 75 y.o. male with medical history significant for coronary artery disease status post PCI with stents x3 in 2003, hyperlipidemia, stage IIIa chronic kidney disease with baseline creatinine 1.5, who is admitted to [facility] on 03/29/2021 with suspected TIA after presenting from home to [facility] ED complaining of slurred speech.    Principal Problem:   TIA (transient ischemic attack) Active Problems:   Coronary artery disease   HLD (hyperlipidemia)   CKD (chronic kidney disease) stage 3, GFR 30-59 ml/min (HCC)     #) TIA: suspected dx on the basis of acute onset of slurred speech, left hand weakness, left hand numbness at 1730 on 03/29/2021, with complete ensuing resolution of the symptoms and spontaneous fashion, with no residual acute focal neurologic deficit noted, with CT head showing no evidence of acute intracranial process, including no evidence of acute intracranial hemorrhage or acute infarct.Will further evaluate with MRI brain, imaging of the neck, and echocardiogram with bubble study, as further detailed below.  Additionally, we will further assess for potential modifiable CVA risk factors, as further detailed below.  Of note, in terms of known modifiable CVA risk factors, the patient acknowledges a history of hyperlipidemia, while denying any known history of underlying hypertension, diabetes, obstructive sleep apnea or paroxysmal atrial fibrillation.  He also confirms that he is a lifelong non-smoker. Will check EKG and closely  monitor on telemetry overnight for evaluation of the presence of previously undiagnosed atrial fibrillation.  No indication for tPA administration given spontaneous complete resolution of suspected presenting acute focal neurologic deficits, without residual deficit at this time.  current outpatient antiplatelet/anticoagulant regimen: Daily baby aspirin. Current outpatient anti-lipid regimen: Simvastatin 40 mg p.o. daily. Will check lipid panel, with consideration for escalation of statin regimen given the suspected TIA occurred while on the above dose of simvastatin.  Additionally, will observe permissive hypertension for 24 hours following the onset of acute focal neurologic deficits, with prn IV antihypertensives for systolic blood pressure greater than [id number] and diastolic blood pressure greater than 110.    NIHSS score: 0    Plan: Nursing bedside swallow evaluation x 1 now, and will not initiate oral medications or diet until the patient has passed this.  Full dose aspirin x1 now, followed by resumption of home daily baby aspirin . head of the bed at 30 degrees. Neuro checks per protocol. VS per protocol. Will allow for permissive hypertension for 24 hours following onset of acute focal neurologic deficits, during which will hold home antihypertensive medications, with prn IV labetalol for  systolic blood pressure greater than [id number] mmHg or diastolic blood pressure greater than 110 mmHg until 1730 on 03/30/2021. Monitor on telemetry, including monitoring for atrial fibrillation as modifiable risk factor for acute ischemic CVA.  MRI brain. Bilateral carotid US. TTE with bubble study has been ordered for the morning to evaluate for intracardiac thrombus, septal wall aneurysm, or septal wall defect. Check lipid panel and A1c. PT/OT/ST consults have been ordered to occur in the morning.  Continue home simvastatin for now.  Check EKG.      #) Coronary artery disease: Documented history of such status post PCI  with stents x3 in 2003.  Outpatient cardiac regimen includes daily baby aspirin, lisinopril, Toprol-XL and simvastatin, as further detailed above.  Denies any recent chest pain.  No evidence to suggest ACS at this time.  Additionally, presenting high-sensitivity troponin I found to be nonelevated, as quantified above.  Plan: Check EKG as component of work-up for presenting suspected TIA.  Continue home daily baby aspirin.  Continue home statin, with panel pending as a component of TIA work-up.  Hold home antihypertensives for now and observance of permissive hypertension for 24 hours in the context of presenting TIA, as further detailed above.        #) Hyperlipidemia: Documented history of such: On simvastatin 40 mg p.o. daily as an outpatient. Will check lipid panel as TIA work-up, with consideration for escalation of current home statin regimen given that suspected TIA occurred on the the above simvastatin dose.  Plan: Check lipid panel in the morning, as above.  Continue home simvastatin for now.        #) Stage IIIa chronic kidney disease: Per chart review, it appears that the patient's baseline creatinine is 1.5, with most recent such value identified on 12/28/2020.  Presenting serum creatinine found to be slightly higher than baseline at 1.73, but does not meet the 0.3 increase over baseline for quantitative threshold for diagnosis of acute kidney injury.  Plan: Monitor strict I's and O's Daily weights.  Tempt avoid nephrotoxic agents.  Holding home lisinopril in the setting of observance of permissive hypertension for now, as above.  Check hemoglobin A1c as component of evaluation for presenting TIA.  Repeat BMP in the morning.     DVT prophylaxis: SCDs Code Status: Full code Family Communication: none Disposition Plan: Per Rounding Team Consults called: none;  Admission status: Observation; med telemetry     Of note, this patient was added by me to the following Admit  List/Treatment Team: armcadmits.      PLEASE NOTE THAT DRAGON DICTATION SOFTWARE WAS USED IN THE CONSTRUCTION OF THIS NOTE.   [facility] Pager [phone] From [place]  Otherwise, please contact night-coverage  [email or web address] Password [facility]   03/29/2021, 10:51 PM

## 2021-03-29 NOTE — ED Triage Notes (Signed)
Pt BIBA from home. Pt states he had slurred speech and some left arm tingling and numbness. Pt states that has since resolved. upon EMS arrival, pt had left bundle branch block which is to believed to be new. Pt has hx of 3 stents, placed between 2000-2003; hx of MI. Pt also adds some jaw pain x several days. Pt A&Ox4

## 2021-03-30 ENCOUNTER — Encounter: Payer: Self-pay | Admitting: Internal Medicine

## 2021-03-30 ENCOUNTER — Observation Stay: Payer: Medicare HMO

## 2021-03-30 ENCOUNTER — Other Ambulatory Visit: Payer: Self-pay

## 2021-03-30 DIAGNOSIS — G459 Transient cerebral ischemic attack, unspecified: Principal | ICD-10-CM

## 2021-03-30 DIAGNOSIS — E7849 Other hyperlipidemia: Secondary | ICD-10-CM

## 2021-03-30 DIAGNOSIS — E785 Hyperlipidemia, unspecified: Secondary | ICD-10-CM | POA: Diagnosis present

## 2021-03-30 DIAGNOSIS — N1831 Chronic kidney disease, stage 3a: Secondary | ICD-10-CM | POA: Diagnosis not present

## 2021-03-30 DIAGNOSIS — N183 Chronic kidney disease, stage 3 unspecified: Secondary | ICD-10-CM | POA: Diagnosis present

## 2021-03-30 DIAGNOSIS — R200 Anesthesia of skin: Secondary | ICD-10-CM | POA: Diagnosis not present

## 2021-03-30 DIAGNOSIS — I63411 Cerebral infarction due to embolism of right middle cerebral artery: Secondary | ICD-10-CM

## 2021-03-30 DIAGNOSIS — R4781 Slurred speech: Secondary | ICD-10-CM | POA: Diagnosis not present

## 2021-03-30 DIAGNOSIS — I6523 Occlusion and stenosis of bilateral carotid arteries: Secondary | ICD-10-CM | POA: Diagnosis not present

## 2021-03-30 DIAGNOSIS — I2510 Atherosclerotic heart disease of native coronary artery without angina pectoris: Secondary | ICD-10-CM | POA: Diagnosis present

## 2021-03-30 LAB — CBC
HCT: 40.3 % (ref 39.0–52.0)
Hemoglobin: 13.2 g/dL (ref 13.0–17.0)
MCH: 28.6 pg (ref 26.0–34.0)
MCHC: 32.8 g/dL (ref 30.0–36.0)
MCV: 87.4 fL (ref 80.0–100.0)
Platelets: 164 10*3/uL (ref 150–400)
RBC: 4.61 MIL/uL (ref 4.22–5.81)
RDW: 12.9 % (ref 11.5–15.5)
WBC: 7.2 10*3/uL (ref 4.0–10.5)
nRBC: 0 % (ref 0.0–0.2)

## 2021-03-30 LAB — COMPREHENSIVE METABOLIC PANEL
ALT: 20 U/L (ref 0–44)
AST: 21 U/L (ref 15–41)
Albumin: 4 g/dL (ref 3.5–5.0)
Alkaline Phosphatase: 60 U/L (ref 38–126)
Anion gap: 7 (ref 5–15)
BUN: 30 mg/dL — ABNORMAL HIGH (ref 8–23)
CO2: 28 mmol/L (ref 22–32)
Calcium: 9.2 mg/dL (ref 8.9–10.3)
Chloride: 103 mmol/L (ref 98–111)
Creatinine, Ser: 1.55 mg/dL — ABNORMAL HIGH (ref 0.61–1.24)
GFR, Estimated: 46 mL/min — ABNORMAL LOW (ref >60)
Glucose, Bld: 102 mg/dL — ABNORMAL HIGH (ref 70–99)
Potassium: 4.6 mmol/L (ref 3.5–5.1)
Sodium: 138 mmol/L (ref 135–145)
Total Bilirubin: 0.9 mg/dL (ref 0.3–1.2)
Total Protein: 6.8 g/dL (ref 6.5–8.1)

## 2021-03-30 LAB — LIPID PANEL
Cholesterol: 153 mg/dL (ref 0–200)
HDL: 38 mg/dL — ABNORMAL LOW (ref >40)
LDL Cholesterol: 96 mg/dL (ref 0–99)
Total CHOL/HDL Ratio: 4 RATIO
Triglycerides: 95 mg/dL (ref <150)
VLDL: 19 mg/dL (ref 0–40)

## 2021-03-30 LAB — HEMOGLOBIN A1C
Hgb A1c MFr Bld: 6.4 % — ABNORMAL HIGH (ref 4.8–5.6)
Mean Plasma Glucose: 136.98 mg/dL

## 2021-03-30 LAB — MAGNESIUM: Magnesium: 2.2 mg/dL (ref 1.7–2.4)

## 2021-03-30 LAB — TROPONIN I (HIGH SENSITIVITY): Troponin I (High Sensitivity): 6 ng/L (ref <18)

## 2021-03-30 MED ORDER — CLOPIDOGREL BISULFATE 75 MG PO TABS
75.0000 mg | ORAL_TABLET | Freq: Every day | ORAL | Status: DC
  Administered 2021-03-30 – 2021-03-31 (×2): 75 mg via ORAL
  Filled 2021-03-30: qty 1

## 2021-03-30 MED ORDER — LORAZEPAM 2 MG/ML IJ SOLN
1.0000 mg | Freq: Once | INTRAMUSCULAR | Status: AC
  Administered 2021-03-30: 06:00:00 1 mg via INTRAVENOUS
  Filled 2021-03-30: qty 1

## 2021-03-30 MED ORDER — ASPIRIN 325 MG PO TABS
325.0000 mg | ORAL_TABLET | Freq: Once | ORAL | Status: AC
  Administered 2021-03-30: 05:00:00 325 mg via ORAL
  Filled 2021-03-30 (×2): qty 1

## 2021-03-30 MED ORDER — ASPIRIN EC 81 MG PO TBEC
81.0000 mg | DELAYED_RELEASE_TABLET | Freq: Every day | ORAL | Status: DC
  Administered 2021-03-30 – 2021-03-31 (×2): 81 mg via ORAL
  Filled 2021-03-30 (×2): qty 1

## 2021-03-30 MED ORDER — SIMVASTATIN 20 MG PO TABS
40.0000 mg | ORAL_TABLET | Freq: Every day | ORAL | Status: DC
  Administered 2021-03-30: 21:00:00 40 mg via ORAL
  Filled 2021-03-30: qty 2

## 2021-03-30 NOTE — Progress Notes (Signed)
PROGRESS NOTE    James Preston  [id number] DOB: Sep 13, 1945 DOA: 03/29/2021 PCP: Derinda Late, MD   Assessment & Plan:   Principal Problem:   TIA (transient ischemic attack) Active Problems:   Coronary artery disease   HLD (hyperlipidemia)   CKD (chronic kidney disease) stage 3, GFR 30-59 ml/min (HCC)   CVA: in the posterior right MCA territory w/ minimal petechial hemorrhage, no malignant hemorrhagic transformation or mass effect. Echo ordered. Continue on aspirin, plavix. Neuro consulted  CAD: s/p 3 stents in 2003. Will allow permissive HTN secondary to CVA. Continue on statin   HLD: continue on statin   CKDIIIa: baseline Cr is 1.73. Cr is trending down from day prior   DVT prophylaxis: SCDs Code Status:  full  Family Communication:  Disposition Plan: likely d/c back home  Level of care: Med-Surg  Status is: Observation  The patient remains OBS appropriate and will d/c before 2 midnights.  Dispo: The patient is from: Home              Anticipated d/c is to: Home              Patient currently is not medically stable to d/c.   Difficult to place patient : unclear      Consultants:  Neuro   Procedures:  Antimicrobials:    Subjective: Pt c/o fatigue   Objective: Vitals:   03/30/21 0455 03/30/21 0500 03/30/21 0836 03/30/21 1127  BP: (!) 109/52  (!) 123/59 (!) 109/57  Pulse: (!) 45  (!) 48 (!) 48  Resp: '16  15 18  '$ Temp: (!) 97.3 F (36.3 C)  98.2 F (36.8 C) 97.8 F (36.6 C)  TempSrc: Oral  Oral Oral  SpO2: 98%  97% 95%  Weight:  84 kg    Height:        Intake/Output Summary (Last 24 hours) at 03/30/2021 1131 Last data filed at 03/30/2021 1011 Gross per 24 hour  Intake 200 ml  Output --  Net 200 ml   Filed Weights   03/29/21 1842 03/30/21 0500  Weight: 83.9 kg 84 kg    Examination:  General exam: Appears calm and comfortable  Respiratory system: Clear to auscultation. Respiratory effort normal. Cardiovascular system: S1 & S2  +. No  rubs, gallops or clicks.  Gastrointestinal system: Abdomen is nondistended, soft and nontender. Hypoactive bowel sounds heard. Central nervous system: Alert and oriented. No facial droop. Moves all 4 extremities. CN 2-12 grossly intact  Psychiatry: Judgement and insight appear normal. Mood & affect appropriate.     Data Reviewed: I have personally reviewed following labs and imaging studies  CBC: Recent Labs  Lab 03/29/21 1845 03/30/21 0711  WBC 7.5 7.2  NEUTROABS 4.7  --   HGB 11.9* 13.2  HCT 34.5* 40.3  MCV 88.5 87.4  PLT 154 [id number]   Basic Metabolic Panel: Recent Labs  Lab 03/29/21 1845 03/30/21 0711  NA 135 138  K 4.2 4.6  CL 105 103  CO2 26 28  GLUCOSE 106* 102*  BUN 36* 30*  CREATININE 1.73* 1.55*  CALCIUM 9.0 9.2  MG 2.3 2.2   GFR: Estimated Creatinine Clearance: 41.4 mL/min (A) (by C-G formula based on SCr of 1.55 mg/dL (H)). Liver Function Tests: Recent Labs  Lab 03/30/21 0711  AST 21  ALT 20  ALKPHOS 60  BILITOT 0.9  PROT 6.8  ALBUMIN 4.0   No results for input(s): LIPASE, AMYLASE in the last 168 hours. No results for input(s): AMMONIA  in the last 168 hours. Coagulation Profile: No results for input(s): INR, PROTIME in the last 168 hours. Cardiac Enzymes: No results for input(s): CKTOTAL, CKMB, CKMBINDEX, TROPONINI in the last 168 hours. BNP (last 3 results) No results for input(s): PROBNP in the last 8760 hours. HbA1C: No results for input(s): HGBA1C in the last 72 hours. CBG: No results for input(s): GLUCAP in the last 168 hours. Lipid Profile: Recent Labs    03/30/21 0711  CHOL 153  HDL 38*  LDLCALC 96  TRIG 95  CHOLHDL 4.0   Thyroid Function Tests: No results for input(s): TSH, T4TOTAL, FREET4, T3FREE, THYROIDAB in the last 72 hours. Anemia Panel: No results for input(s): VITAMINB12, FOLATE, FERRITIN, TIBC, IRON, RETICCTPCT in the last 72 hours. Sepsis Labs: No results for input(s): PROCALCITON, LATICACIDVEN in the last 168  hours.  Recent Results (from the past 240 hour(s))  Resp Panel by RT-PCR (Flu A&B, Covid) Nasopharyngeal Swab     Status: None   Collection Time: 03/29/21 10:52 PM   Specimen: Nasopharyngeal Swab; Nasopharyngeal(NP) swabs in vial transport medium  Result Value Ref Range Status   SARS Coronavirus 2 by RT PCR NEGATIVE NEGATIVE Final    Comment: (NOTE) SARS-CoV-2 target nucleic acids are NOT DETECTED.  The SARS-CoV-2 RNA is generally detectable in upper respiratory specimens during the acute phase of infection. The lowest concentration of SARS-CoV-2 viral copies this assay can detect is 138 copies/mL. A negative result does not preclude SARS-Cov-2 infection and should not be used as the sole basis for treatment or other patient management decisions. A negative result may occur with  improper specimen collection/handling, submission of specimen other than nasopharyngeal swab, presence of viral mutation(s) within the areas targeted by this assay, and inadequate number of viral copies(<138 copies/mL). A negative result must be combined with clinical observations, patient history, and epidemiological information. The expected result is Negative.  Fact Sheet for Patients:  EntrepreneurPulse.com.au  Fact Sheet for Healthcare Providers:  IncredibleEmployment.be  This test is no t yet approved or cleared by the [place] FDA and  has been authorized for detection and/or diagnosis of SARS-CoV-2 by FDA under an Emergency Use Authorization (EUA). This EUA will remain  in effect (meaning this test can be used) for the duration of the COVID-19 declaration under Section 564(b)(1) of the Act, 21 U.S.C.section 360bbb-3(b)(1), unless the authorization is terminated  or revoked sooner.       Influenza A by PCR NEGATIVE NEGATIVE Final   Influenza B by PCR NEGATIVE NEGATIVE Final    Comment: (NOTE) The Xpert Xpress SARS-CoV-2/FLU/RSV plus assay is intended  as an aid in the diagnosis of influenza from Nasopharyngeal swab specimens and should not be used as a sole basis for treatment. Nasal washings and aspirates are unacceptable for Xpert Xpress SARS-CoV-2/FLU/RSV testing.  Fact Sheet for Patients: EntrepreneurPulse.com.au  Fact Sheet for Healthcare Providers: IncredibleEmployment.be  This test is not yet approved or cleared by the [place] FDA and has been authorized for detection and/or diagnosis of SARS-CoV-2 by FDA under an Emergency Use Authorization (EUA). This EUA will remain in effect (meaning this test can be used) for the duration of the COVID-19 declaration under Section 564(b)(1) of the Act, 21 U.S.C. section 360bbb-3(b)(1), unless the authorization is terminated or revoked.  Performed at [facility], [address] [place], [place] [address]          Radiology Studies: CT HEAD WO CONTRAST (5MM)  Result Date: 03/29/2021 CLINICAL DATA:  Left arm numbness.  Slurred speech. EXAM: CT HEAD WITHOUT CONTRAST TECHNIQUE: Contiguous axial images were obtained from the base of the skull through the vertex without intravenous contrast. COMPARISON:  Brain MRI 06/24/2016 FINDINGS: Brain: No intracranial hemorrhage, mass effect, or midline shift. No hydrocephalus. The basilar cisterns are patent. No evidence of territorial infarct or acute ischemia. Minimal periventricular chronic small vessel ischemia, similar to prior MRI. No extra-axial or intracranial fluid collection. Vascular: No hyperdense vessel or unexpected calcification. Skull: Normal. Negative for fracture or focal lesion. Sinuses/Orbits: Minimal opacification of lower right mastoid air cells. Mastoid air cells otherwise clear. Paranasal sinuses are clear. Other: None. IMPRESSION: 1. No acute intracranial abnormality. 2. Minimal opacification of lower right mastoid air cells. Electronically Signed   By: Keith Rake M.D.    On: 03/29/2021 19:54   MR BRAIN WO CONTRAST  Result Date: 03/30/2021 CLINICAL DATA:  75 year old male with slurred speech and left arm numbness. EXAM: MRI HEAD WITHOUT CONTRAST TECHNIQUE: Multiplanar, multiecho pulse sequences of the brain and surrounding structures were obtained without intravenous contrast. COMPARISON:  Head CT 03/29/2021.  Brain MRI 06/24/2016. FINDINGS: Brain: There are multiple small cortical foci of restricted diffusion in the right superior perirolandic and postcentral parietal cortex (series 5, images 36-39). No other right MCA territory restricted diffusion identified. Mild if any associated cortical T2 and FLAIR hyperintensity in the affected areas. Suggestion of petechial hemorrhage on series 12, image 44. But no malignant hemorrhagic transformation. No mass effect. Elsewhere no restricted diffusion to suggest acute infarction. No midline shift, mass effect, evidence of mass lesion, ventriculomegaly, extra-axial collection. Cervicomedullary junction and pituitary are within normal limits. Cerebral volume is within normal limits for age. Cavum septum pellucidum, normal variant. Widely scattered small white matter T2 and FLAIR hyperintense foci in both hemispheres have not significantly changed since 2017. Two small chronic lacunar infarcts in the right cerebellum are stable. No chronic cortical encephalomalacia identified. Chronic microhemorrhage in the left occipital lobe. Deep gray matter nuclei and brainstem remain within normal limits. Vascular: Major intracranial vascular flow voids are stable since 2017. Skull and upper cervical spine: Negative. Normal bone marrow signal. Sinuses/Orbits: Stable, negative. Other: Mastoids remain well aerated. Negative visible scalp and face. IMPRESSION: 1. Scattered small acute cortical infarcts in the posterior Right MCA territory. Minimal petechial hemorrhage. No malignant hemorrhagic transformation or mass effect. 2. Otherwise stable noncontrast  MRI appearance of the brain since 2017, with evidence of chronic small vessel disease in the cerebral white matter and cerebellum. Electronically Signed   By: Genevie Ann M.D.   On: 03/30/2021 07:20   US Carotid Bilateral  Result Date: 03/30/2021 CLINICAL DATA:  75 year old male with TIA EXAM: BILATERAL CAROTID DUPLEX ULTRASOUND TECHNIQUE: Pearline Cables scale imaging, color Doppler and duplex ultrasound were performed of bilateral carotid and vertebral arteries in the neck. COMPARISON:  None. FINDINGS: Criteria: Quantification of carotid stenosis is based on velocity parameters that correlate the residual internal carotid diameter with NASCET-based stenosis levels, using the diameter of the distal internal carotid lumen as the denominator for stenosis measurement. The following velocity measurements were obtained: RIGHT ICA:  Systolic 74 cm/sec, Diastolic 15 cm/sec CCA:  87 cm/sec SYSTOLIC ICA/CCA RATIO:  0.8 ECA:  89 cm/sec LEFT ICA:  Systolic 74 cm/sec, Diastolic 25 cm/sec CCA:  89 cm/sec SYSTOLIC ICA/CCA RATIO:  0.8 ECA:  66 cm/sec Right Brachial SBP: Not acquired Left Brachial SBP: Not acquired RIGHT CAROTID ARTERY: No significant calcified disease of the right common carotid artery. Intermediate waveform maintained. Heterogeneous plaque without significant  calcifications at the right carotid bifurcation. Low resistance waveform of the right ICA. No significant tortuosity. RIGHT VERTEBRAL ARTERY: Antegrade flow with low resistance waveform. LEFT CAROTID ARTERY: No significant calcified disease of the left common carotid artery. Intermediate waveform maintained. Heterogeneous plaque at the left carotid bifurcation without significant calcifications. Low resistance waveform of the left ICA. LEFT VERTEBRAL ARTERY:  Antegrade flow with low resistance waveform. IMPRESSION: Color duplex indicates minimal heterogeneous plaque, with no hemodynamically significant stenosis by duplex criteria in the extracranial cerebrovascular  circulation. Signed, Dulcy Fanny. Dellia Nims, RPVI [facility] Radiology Electronically Signed   By: Corrie Mckusick D.O.   On: 03/30/2021 09:27        Scheduled Meds:  aspirin EC  81 mg Oral Daily   clopidogrel  75 mg Oral Daily   simvastatin  40 mg Oral QHS   Continuous Infusions:   LOS: 0 days    Time spent: 30 mins     Wyvonnia Dusky, MD [facility] Pager [phone]  If 7PM-7AM, please contact night-coverage 03/30/2021, 11:31 AM

## 2021-03-30 NOTE — Consult Note (Signed)
NEURO HOSPITALIST CONSULT NOTE   Requesting physician: Dr. Jimmye Norman  Reason for Consult: Acute strokes seen on MRI  History obtained from:   Patient and Chart     HPI:                                                                                                                                          James Preston is a 75 y.o. male with a history of CAD (s/p stents x 3), MI, CKD3 and HLD who presented to the ED on Monday evening with complaints of new onset slurred speech with some left arm weakness, tingling and numbness. Symptoms began at about 5:30-6 PM, with the patient experiencing a sensation described as his left arm going to sleep. He was having a hard time putting a lid back on a jar. He had been putting crown molding on a structure earlier in the day. Symptoms resolved after about 1 hour. He denied any other neurological symptoms associated with the above, including no vision changes, facial droop, vertigo, N/V, CP, presyncope, diaphoresis, SOB or headache. MRI revealed a cluster of approximately 4 small cortically based ischemic infarctions in the posterior right MCA territory.   He has no prior history of stroke. He takes ASA 81 mg and simvastatin 40 mg daily.   Past Medical History:  Diagnosis Date   Coronary artery disease    HLD (hyperlipidemia)     Past Surgical History:  Procedure Laterality Date   CARDIAC SURGERY     CORONARY ANGIOPLASTY WITH STENT PLACEMENT  2003   x3    History reviewed. No pertinent family history.           Social History:  reports that he has never smoked. He has never used smokeless tobacco. He reports current alcohol use. He reports that he does not use drugs.  No Known Allergies  MEDICATIONS:                                                                                                                     Prior to Admission:  Medications Prior to Admission  Medication Sig Dispense Refill Last Dose   aspirin  81 MG EC tablet Take 81 mg by mouth daily.   03/29/2021 at unknown   lisinopril (  ZESTRIL) 5 MG tablet Take 5 mg by mouth daily.   03/29/2021 at unknown   meloxicam (MOBIC) 7.5 MG tablet Take 7.5 mg by mouth 2 (two) times daily after a meal.   03/29/2021 at unknown   metoprolol succinate (TOPROL-XL) 50 MG 24 hr tablet Take 50 mg by mouth daily.   03/29/2021 at unknown   simvastatin (ZOCOR) 40 MG tablet Take 40 mg by mouth at bedtime.   03/29/2021 at unknown   Scheduled:  aspirin EC  81 mg Oral Daily   simvastatin  40 mg Oral QHS   Continuous:   ROS:                                                                                                                                       As per HPI.    Blood pressure (!) 123/59, pulse (!) 48, temperature 98.2 F (36.8 C), temperature source Oral, resp. rate 15, height '5\' 7"'$  (1.702 m), weight 84 kg, SpO2 97 %.   General Examination:                                                                                                       Physical Exam  HEENT-  [place]/AT    Lungs- Respirations unlabored Extremities- No edema  Neurological Examination Mental Status: Alert, oriented x 5, thought content appropriate.  Speech fluent with intact naming and comprehension. No dysarthria.  Cranial Nerves: [id number] visual fields intact with no extinction to DSS.   III,IV, VI: No ptosis. EOMI. No nystagmus.  V,VII: Smile symmetric, facial temp sensation equal bilaterally VIII: hearing intact to voice IX,X: No hypophonia XI: Subtle lag on the left with shoulder shrug XII: Midline tongue extension Motor: BUE 5/5 proximally and distally without asymmetry.  No pronator drift Negative orbiting fingers test BLE 5/5 proximally and distally. No asymmetry Sensory: Temp and FT intact in upper and lower extremities without asymmetry. No extinction to DSS.  Deep Tendon Reflexes: 1+ and symmetric brachioradialis and biceps. Trace patellar reflexes. Toes downgoing  bilaterally.  Cerebellar: No ataxia with FNF bilaterally Gait: Deferred   Lab Results: Basic Metabolic Panel: Recent Labs  Lab 03/29/21 1845 03/30/21 0711  NA 135 138  K 4.2 4.6  CL 105 103  CO2 26 28  GLUCOSE 106* 102*  BUN 36* 30*  CREATININE 1.73* 1.55*  CALCIUM 9.0 9.2  MG 2.3 2.2    CBC: Recent Labs  Lab 03/29/21 1845 03/30/21 0711  WBC 7.5 7.2  NEUTROABS 4.7  --   HGB 11.9* 13.2  HCT 34.5* 40.3  MCV 88.5 87.4  PLT 154 164    Cardiac Enzymes: No results for input(s): CKTOTAL, CKMB, CKMBINDEX, TROPONINI in the last 168 hours.  Lipid Panel: Recent Labs  Lab 03/30/21 0711  CHOL 153  TRIG 95  HDL 38*  CHOLHDL 4.0  VLDL 19  LDLCALC 96    Imaging: CT HEAD WO CONTRAST (5MM)  Result Date: 03/29/2021 CLINICAL DATA:  Left arm numbness.  Slurred speech. EXAM: CT HEAD WITHOUT CONTRAST TECHNIQUE: Contiguous axial images were obtained from the base of the skull through the vertex without intravenous contrast. COMPARISON:  Brain MRI 06/24/2016 FINDINGS: Brain: No intracranial hemorrhage, mass effect, or midline shift. No hydrocephalus. The basilar cisterns are patent. No evidence of territorial infarct or acute ischemia. Minimal periventricular chronic small vessel ischemia, similar to prior MRI. No extra-axial or intracranial fluid collection. Vascular: No hyperdense vessel or unexpected calcification. Skull: Normal. Negative for fracture or focal lesion. Sinuses/Orbits: Minimal opacification of lower right mastoid air cells. Mastoid air cells otherwise clear. Paranasal sinuses are clear. Other: None. IMPRESSION: 1. No acute intracranial abnormality. 2. Minimal opacification of lower right mastoid air cells. Electronically Signed   By: Keith Rake M.D.   On: 03/29/2021 19:54   MR BRAIN WO CONTRAST  Result Date: 03/30/2021 CLINICAL DATA:  75 year old male with slurred speech and left arm numbness. EXAM: MRI HEAD WITHOUT CONTRAST TECHNIQUE: Multiplanar, multiecho pulse  sequences of the brain and surrounding structures were obtained without intravenous contrast. COMPARISON:  Head CT 03/29/2021.  Brain MRI 06/24/2016. FINDINGS: Brain: There are multiple small cortical foci of restricted diffusion in the right superior perirolandic and postcentral parietal cortex (series 5, images 36-39). No other right MCA territory restricted diffusion identified. Mild if any associated cortical T2 and FLAIR hyperintensity in the affected areas. Suggestion of petechial hemorrhage on series 12, image 44. But no malignant hemorrhagic transformation. No mass effect. Elsewhere no restricted diffusion to suggest acute infarction. No midline shift, mass effect, evidence of mass lesion, ventriculomegaly, extra-axial collection. Cervicomedullary junction and pituitary are within normal limits. Cerebral volume is within normal limits for age. Cavum septum pellucidum, normal variant. Widely scattered small white matter T2 and FLAIR hyperintense foci in both hemispheres have not significantly changed since 2017. Two small chronic lacunar infarcts in the right cerebellum are stable. No chronic cortical encephalomalacia identified. Chronic microhemorrhage in the left occipital lobe. Deep gray matter nuclei and brainstem remain within normal limits. Vascular: Major intracranial vascular flow voids are stable since 2017. Skull and upper cervical spine: Negative. Normal bone marrow signal. Sinuses/Orbits: Stable, negative. Other: Mastoids remain well aerated. Negative visible scalp and face. IMPRESSION: 1. Scattered small acute cortical infarcts in the posterior Right MCA territory. Minimal petechial hemorrhage. No malignant hemorrhagic transformation or mass effect. 2. Otherwise stable noncontrast MRI appearance of the brain since 2017, with evidence of chronic small vessel disease in the cerebral white matter and cerebellum. Electronically Signed   By: Genevie Ann M.D.   On: 03/30/2021 07:20   US Carotid  Bilateral  Result Date: 03/30/2021 CLINICAL DATA:  75 year old male with TIA EXAM: BILATERAL CAROTID DUPLEX ULTRASOUND TECHNIQUE: Pearline Cables scale imaging, color Doppler and duplex ultrasound were performed of bilateral carotid and vertebral arteries in the neck. COMPARISON:  None. FINDINGS: Criteria: Quantification of carotid stenosis is based on velocity parameters that correlate the residual internal carotid diameter with NASCET-based stenosis levels, using the diameter of the distal internal carotid lumen  as the denominator for stenosis measurement. The following velocity measurements were obtained: RIGHT ICA:  Systolic 74 cm/sec, Diastolic 15 cm/sec CCA:  87 cm/sec SYSTOLIC ICA/CCA RATIO:  0.8 ECA:  89 cm/sec LEFT ICA:  Systolic 74 cm/sec, Diastolic 25 cm/sec CCA:  89 cm/sec SYSTOLIC ICA/CCA RATIO:  0.8 ECA:  66 cm/sec Right Brachial SBP: Not acquired Left Brachial SBP: Not acquired RIGHT CAROTID ARTERY: No significant calcified disease of the right common carotid artery. Intermediate waveform maintained. Heterogeneous plaque without significant calcifications at the right carotid bifurcation. Low resistance waveform of the right ICA. No significant tortuosity. RIGHT VERTEBRAL ARTERY: Antegrade flow with low resistance waveform. LEFT CAROTID ARTERY: No significant calcified disease of the left common carotid artery. Intermediate waveform maintained. Heterogeneous plaque at the left carotid bifurcation without significant calcifications. Low resistance waveform of the left ICA. LEFT VERTEBRAL ARTERY:  Antegrade flow with low resistance waveform. IMPRESSION: Color duplex indicates minimal heterogeneous plaque, with no hemodynamically significant stenosis by duplex criteria in the extracranial cerebrovascular circulation. Signed, Dulcy Fanny. Dellia Nims, RPVI [facility] Radiology Electronically Signed   By: Corrie Mckusick D.O.   On: 03/30/2021 09:27     Assessment: 75  year old male presenting with slurred speech, left arm tingling and numbness. MRI reveals scattered small posterior right MCA territory acute cortical ischemic infarctions.  1. Neurological exam is nonfocal.  2. MRI brain: Approximately 4 scattered small acute cortical infarcts are seen in the posterior right MCA territory. There is minimal associated petechial hemorrhage. Otherwise stable noncontrast MRI appearance of the brain since 2017, with evidence of chronic small vessel disease in the cerebral white matter and cerebellum. 3. Carotid ultrasound: Color duplex indicates minimal heterogeneous plaque, with no hemodynamically significant stenosis by duplex criteria in the extracranial cerebrovascular circulation. 4. Stroke risk factors:  CAD, CKD and HLD  Recommendations: 1. HgbA1c, fasting lipid panel 2. MRA of the brain without contrast 3. PT has seen the patient with some deficits noted.  4. Speech therapy has evaluated the patient and per their note, no further skilled ST services indicated as patient appears at his baseline.  5. OT consult 6. TTE with bubble study 7. Continue Zocor 40 mg qhs 8. Adding Plavix to ASA. He has failed ASA monotherapy.  9. Telemetry monitoring 10. Frequent neuro checks 11. Permissive HTN until 6 PM today   Electronically signed: Dr. Kerney Elbe 03/30/2021, 10:47 AM

## 2021-03-30 NOTE — Progress Notes (Signed)
Skin swarm completed, no skin issues identified. Ambulated independently in room and up to bathroom. Admitted to tele monitor. Oriented to room and call light system. Denies further needs at this time.

## 2021-03-30 NOTE — Progress Notes (Signed)
OT Screen Note  Patient Details Name: James Preston MRN: [id number] DOB: [birth year]   Cancelled Treatment:    Reason Eval/Treat Not Completed: OT screened, no needs identified, will sign off. Thank you for the OT consult. Order received and chart reviewed. Pt seen by OT, reports all symptoms have resolved. He reports feeling back back to baseline level of functional independence. He states he is up ad lib in room using the bathroom and completing self care. No cognitive, visual, strength, or sensory deficits appreciated. OT will sign off at this time. Please re-consult if additional OT needs arise during this admission.   Shara Blazing, M.S., OTR/L Ascom: [phone] 03/30/21, 1:07 PM

## 2021-03-30 NOTE — Progress Notes (Addendum)
SLP Cancellation Note  Patient Details Name: James Preston MRN: [id number] DOB: October 25, 1945   Cancelled treatment:       Reason Eval/Treat Not Completed: SLP screened, no needs identified, will sign off (consulted NSG, pt; reviewed chart). Met w/ pt before he left for test. He was finishing the last few bites of his breakfast meal. Pt denied any difficulty swallowing and is currently on a regular diet; tolerates swallowing pills w/ water per NSG. Pt conversed in general conversation w/out expressive/receptive deficits noted; pt denied any speech-language deficits. Speech clear. Pt stated his speech "sounded like myself".  No further skilled ST services indicated as pt appears at his baseline. Pt agreed. NSG to reconsult if any change in status while admitted.       Orinda Kenner, MS, CCC-SLP Speech Language Pathologist Rehab Services [phone] [facility] 03/30/2021, 9:24 AM

## 2021-03-30 NOTE — Evaluation (Signed)
Physical Therapy Evaluation Patient Details Name: James Preston MRN: [id number] DOB: April 20, 1946 Today's Date: 03/30/2021   History of Present Illness  As Per MD report: James Preston is a 75 y.o. male with medical history significant for coronary artery disease status post PCI with stents x3 in 2003, hyperlipidemia, stage IIIa chronic kidney disease with baseline creatinine 1.5, who is admitted to [facility] on 03/29/2021 with suspected TIA after presenting from home to [facility] ED complaining of slurred speech.      The patient reports sudden onset of slurred speech and left hand weakness associated with numbness at 1730 today.  He reports that these symptoms lasted approximately 1 hour before spontaneously resolving without subsequent recurrence.  Denies any residual acute focal neurologic deficits at this time. Pt is undergoing diagnostic studies including Echo, [place], MRI.   Clinical Impression  Patient agreed to participate with PT evaluation. Pt A& O x 4. Pt is in precess of getting all the testing completed. Pt demonstrated 4/5 MMT B knee and Ankle, 3+ To B HIP revealing proximal weakness L > R. Pt's balance is mildly compromised L>R evident by the SLS and tandem standing deficits with mild sways to L with change in direction. Pt has back pain with symptoms in BLE over past one year. Pt will benefit from balance, stairs and Low back exs.     Follow Up Recommendations Outpatient PT    Equipment Recommendations       Recommendations for Other Services       Precautions / Restrictions Precautions Precautions: Fall Restrictions Weight Bearing Restrictions: No      Mobility  Bed Mobility Overal bed mobility: Independent                  Transfers Overall transfer level: Independent Equipment used: None                Ambulation/Gait Ambulation/Gait assistance: Supervision Gait Distance (Feet): 200 Feet Assistive device: None Gait  Pattern/deviations: WFL(Within Functional Limits)     General Gait Details: mild sways with change of directions  Stairs            Wheelchair Mobility    Modified Rankin (Stroke Patients Only)       Balance Overall balance assessment: Needs assistance   Sitting balance-Leahy Scale: Normal     Standing balance support: No upper extremity supported Standing balance-Leahy Scale: Good   Single Leg Stance - Right Leg: 6 (unsteady) Single Leg Stance - Left Leg: 4 (unsteady) Tandem Stance - Right Leg: 7 Tandem Stance - Left Leg: 3 (unsteady) Rhomberg - Eyes Opened:  (NBOS) Rhomberg - Eyes Closed: 10   High Level Balance Comments: Unsteady on L>R             Pertinent Vitals/Pain Pain Assessment: No/denies pain    Home Living Family/patient expects to be discharged to:: Private residence Living Arrangements: Spouse/significant other   Type of Home: House Home Access: Stairs to enter   Technical brewer of Steps: 2 Home Layout: One level Home Equipment: None Additional Comments: Pt independent without AD at household and community level activity particiaption. Pt does home improving projects as his hobby.    Prior Function Level of Independence: Independent               Hand Dominance   Dominant Hand: Right    Extremity/Trunk Assessment   Upper Extremity Assessment Upper Extremity Assessment: LUE deficits/detail LUE Deficits / Details: 3+/5  Lower Extremity Assessment Lower Extremity Assessment: Defer to PT evaluation (Proximal weakness)       Communication   Communication: No difficulties  Cognition Arousal/Alertness: Awake/alert Behavior During Therapy: WFL for tasks assessed/performed Overall Cognitive Status: Within Functional Limits for tasks assessed                                        General Comments      Exercises     Assessment/Plan    PT Assessment Patient needs continued PT services  PT  Problem List Decreased strength;Decreased balance       PT Treatment Interventions Neuromuscular re-education    PT Goals (Current goals can be found in the Care Plan section)  Acute Rehab PT Goals Patient Stated Goal: " I also have sciatica and so would consider out patient theraoy." PT Goal Formulation: With patient Time For Goal Achievement: 04/13/21 Potential to Achieve Goals: Good    Frequency Min 2X/week   Barriers to discharge        Co-evaluation               AM-PAC PT "6 Clicks" Mobility  Outcome Measure Help needed turning from your back to your side while in a flat bed without using bedrails?: None Help needed moving from lying on your back to sitting on the side of a flat bed without using bedrails?: None Help needed moving to and from a bed to a chair (including a wheelchair)?: None Help needed standing up from a chair using your arms (e.g., wheelchair or bedside chair)?: None Help needed to walk in hospital room?: None Help needed climbing 3-5 steps with a railing? : A Little 6 Click Score: 23    End of Session Equipment Utilized During Treatment: Gait belt Activity Tolerance: Patient tolerated treatment well Patient left: in bed;with nursing/sitter in room Nurse Communication: Mobility status (nurse present thorughout the session.) PT Visit Diagnosis: Unsteadiness on feet (R26.81);Muscle weakness (generalized) (M62.81);Other abnormalities of gait and mobility (R26.89)    Time: [id number] PT Time Calculation (min) (ACUTE ONLY): 24 min   Charges:   PT Evaluation $PT Eval Low Complexity: 1 Low PT Treatments $Neuromuscular Re-education: 8-22 mins       Joaquin Music PT DPT 10:36 AM,03/30/21

## 2021-03-31 ENCOUNTER — Observation Stay
Admit: 2021-03-31 | Discharge: 2021-03-31 | Disposition: A | Discharge status: Home / Self Care | Payer: Medicare HMO | Attending: Internal Medicine | Admitting: Internal Medicine

## 2021-03-31 ENCOUNTER — Observation Stay: Admit: 2021-03-31 | Payer: Medicare HMO

## 2021-03-31 ENCOUNTER — Observation Stay: Payer: Medicare HMO

## 2021-03-31 DIAGNOSIS — I6529 Occlusion and stenosis of unspecified carotid artery: Secondary | ICD-10-CM | POA: Diagnosis not present

## 2021-03-31 DIAGNOSIS — Z8673 Personal history of transient ischemic attack (TIA), and cerebral infarction without residual deficits: Secondary | ICD-10-CM | POA: Diagnosis not present

## 2021-03-31 DIAGNOSIS — N1831 Chronic kidney disease, stage 3a: Secondary | ICD-10-CM | POA: Diagnosis not present

## 2021-03-31 DIAGNOSIS — E7849 Other hyperlipidemia: Secondary | ICD-10-CM | POA: Diagnosis not present

## 2021-03-31 DIAGNOSIS — I6501 Occlusion and stenosis of right vertebral artery: Secondary | ICD-10-CM | POA: Diagnosis not present

## 2021-03-31 LAB — BASIC METABOLIC PANEL
Anion gap: 9 (ref 5–15)
BUN: 27 mg/dL — ABNORMAL HIGH (ref 8–23)
CO2: 24 mmol/L (ref 22–32)
Calcium: 9.3 mg/dL (ref 8.9–10.3)
Chloride: 105 mmol/L (ref 98–111)
Creatinine, Ser: 1.49 mg/dL — ABNORMAL HIGH (ref 0.61–1.24)
GFR, Estimated: 48 mL/min — ABNORMAL LOW (ref >60)
Glucose, Bld: 105 mg/dL — ABNORMAL HIGH (ref 70–99)
Potassium: 4.7 mmol/L (ref 3.5–5.1)
Sodium: 138 mmol/L (ref 135–145)

## 2021-03-31 LAB — ECHOCARDIOGRAM COMPLETE BUBBLE STUDY
AR max vel: 3.11 cm2
AV Area VTI: 2.89 cm2
AV Area mean vel: 2.63 cm2
AV Mean grad: 2 mmHg
AV Peak grad: 3.7 mmHg
Ao pk vel: 0.96 m/s
Area-P 1/2: 2.61 cm2
Calc EF: 47.8 %
S' Lateral: 3.59 cm
Single Plane A2C EF: 47.4 %
Single Plane A4C EF: 42.8 %

## 2021-03-31 LAB — CBC
HCT: 37.6 % — ABNORMAL LOW (ref 39.0–52.0)
Hemoglobin: 13.2 g/dL (ref 13.0–17.0)
MCH: 30.6 pg (ref 26.0–34.0)
MCHC: 35.1 g/dL (ref 30.0–36.0)
MCV: 87 fL (ref 80.0–100.0)
Platelets: 144 10*3/uL — ABNORMAL LOW (ref 150–400)
RBC: 4.32 MIL/uL (ref 4.22–5.81)
RDW: 12.8 % (ref 11.5–15.5)
WBC: 6.8 10*3/uL (ref 4.0–10.5)
nRBC: 0 % (ref 0.0–0.2)

## 2021-03-31 MED ORDER — CLOPIDOGREL BISULFATE 75 MG PO TABS: 75.0000 mg | ORAL_TABLET | Freq: Every day | ORAL | 0 refills | Status: AC

## 2021-03-31 MED ORDER — LORAZEPAM 2 MG/ML IJ SOLN
2.0000 mg | Freq: Once | INTRAMUSCULAR | Status: AC
  Administered 2021-03-31: 17:00:00 2 mg via INTRAVENOUS
  Filled 2021-03-31: qty 1

## 2021-03-31 NOTE — Progress Notes (Signed)
PT Cancellation Note  Patient Details Name: James Preston MRN: [id number] DOB: November 14, 1945   Cancelled Treatment:    Reason Eval/Treat Not Completed: Patient declined, no reason specified. Pt stated " I walk to the bathroom and nurses desk as I need. I feel fine. I will rest for now." PT will consult pt tomorrow.   Joaquin Music PT DPT 4:36 PM,03/31/21

## 2021-03-31 NOTE — Care Management Obs Status (Signed)
[place] NOTIFICATION   Patient Details  Name: Jadynn Hurdle MRN: [id number] Date of Birth: [birth year]   Medicare Observation Status Notification Given:  Yes    Shelbie Hutching, RN 03/31/2021, 12:53 PM

## 2021-03-31 NOTE — Discharge Summary (Signed)
Physician Discharge Summary  James Preston [id number] DOB: 06-Sep-1945 DOA: 03/29/2021  PCP: Derinda Late, MD  Admit date: 03/29/2021 Discharge date: 03/31/2021  Admitted From: home Disposition: home  Recommendations for Outpatient Follow-up:  Follow up with PCP in 1-2 weeks F/u w/ neuro in 1-2 weeks  F/u w/ cardio in 1-2 weeks for slightly reduced EF 45-50%  Home Health:no  Equipment/Devices:  Discharge Condition: stable  CODE STATUS: full  Diet recommendation: Heart Healthy / Carb Modified   Brief/Interim Summary: HPI was taken from Dr. Velia Meyer: James Preston is a 75 y.o. male with medical history significant for coronary artery disease status post PCI with stents x3 in 2003, hyperlipidemia, stage IIIa chronic kidney disease with baseline creatinine 1.5, who is admitted to [facility] on 03/29/2021 with suspected TIA after presenting from home to [facility] ED complaining of slurred speech.    The patient reports sudden onset of slurred speech and left hand weakness associated with numbness at 1730 today.  He reports that these symptoms lasted approximately 1 hour before spontaneously resolving without subsequent recurrence.  Denies any residual acute focal neurologic deficits at this time.  He reports that the above symptoms were not associated with any dysphagia, dizziness, vertigo, nausea, vomiting, acute change in vision, blurry vision, diplopia, facial droop, or headache.  The patient was with his wife at the time of onset of the above symptoms, he was able to confirm the presence of slurred speech at that time.  Denies any associated chest pain, shortness of breath, palpitations, diaphoresis, presyncope, or syncope.   Denies any previous history of stroke. In terms of modifiable risk factors, the patient acknowledges a history of hyperlipidemia, but denies any known underlying history of hypertension, reporting that beta-blocker and ACE inhibitor that he  takes as an outpatient is on the basis of a purely cardiac indication stemming from a history of coronary artery disease.  Denies any known history of underlying diabetes, atrial fibrillation, or obstructive sleep apnea.  Reports that he is a lifelong non-smoker.  Reports good compliance with his outpatient antihypertensive medications as well as simvastatin 40 mg p.o. daily.  He is on a daily baby aspirin, with most recent dose occurring on the morning of 03/29/2021, and denies any use of additional antiplatelet or anticoagulant medications as an outpatient.   He was brought to [facility] ED for further evaluation and management of the above.  Of note, his initial slurred speech, left hand weakness, and left hand numbness all completely resolved and spontaneous fashion prior to being seen by the EDP.    Denies any associated subjective fever, chills, rigors, or generalized myalgias.  No recent neck stiffness, rhinitis, rhinorrhea, sore throat, wheezing, cough, abdominal pain, or rash.  No recent traveling or known COVID-19 exposures.  Denies any recent dysuria, gross hematuria, or change in urinary urgency/frequency.  Hospital course from Dr. Jimmye Norman 8/9-8/10/22: Pt presented w/ slurred speech and was found to have a CVA. MRI brain showed in the posterior right MCA territory w/ minimal petechial hemorrhage, no malignant hemorrhagic transformation or mass effect. Pt was put on plavix and was continued on aspirin as per neuro. Echo was done which showed EF [id number], normal diastolic function & no evidence of any interatrial shunt. Of note, pt was found to have pre-DM w/ HbA1c 6.4 and pt was counseled on this. Pt was aware of this as pt previously had elevated HbA1c in the pre-DM range.    Discharge Diagnoses:  Principal Problem:  TIA (transient ischemic attack) Active Problems:   Coronary artery disease   HLD (hyperlipidemia)   CKD (chronic kidney disease) stage 3, GFR 30-59 ml/min (HCC)  CVA: in the  posterior right MCA territory w/ minimal petechial hemorrhage, no malignant hemorrhagic transformation or mass effect. Echo shows EF [id number], normal diastolic function & no evidence of any interatrial shunt.  Continue on aspirin, plavix. Neuro following   CAD: s/p 3 stents in 2003. Restarted back home dose of BB, lisinopril   HLD: continue on statin   CKDIIIa: baseline Cr is 1.73. Cr continues to trend down daily   Discharge Instructions  Discharge Instructions     Diet - low sodium heart healthy   Complete by: As directed    Diet Carb Modified   Complete by: As directed    Discharge instructions   Complete by: As directed    F/u w/ PCP in 1-2 weeks. F/u w/ neuro in 1-2 weeks   Increase activity slowly   Complete by: As directed       Allergies as of 03/31/2021   No Known Allergies      Medication List     STOP taking these medications    meloxicam 7.5 MG tablet Commonly known as: MOBIC       TAKE these medications    aspirin 81 MG EC tablet Take 81 mg by mouth daily.   clopidogrel 75 MG tablet Commonly known as: PLAVIX Take 1 tablet (75 mg total) by mouth daily. Start taking on: April 01, 2021   lisinopril 5 MG tablet Commonly known as: ZESTRIL Take 5 mg by mouth daily.   metoprolol succinate 50 MG 24 hr tablet Commonly known as: TOPROL-XL Take 50 mg by mouth daily.   simvastatin 40 MG tablet Commonly known as: ZOCOR Take 40 mg by mouth at bedtime.        No Known Allergies  Consultations: Neuro, Dr. Cheral Marker    Procedures/Studies: CT HEAD WO CONTRAST (5MM)  Result Date: 03/29/2021 CLINICAL DATA:  Left arm numbness.  Slurred speech. EXAM: CT HEAD WITHOUT CONTRAST TECHNIQUE: Contiguous axial images were obtained from the base of the skull through the vertex without intravenous contrast. COMPARISON:  Brain MRI 06/24/2016 FINDINGS: Brain: No intracranial hemorrhage, mass effect, or midline shift. No hydrocephalus. The basilar cisterns are patent.  No evidence of territorial infarct or acute ischemia. Minimal periventricular chronic small vessel ischemia, similar to prior MRI. No extra-axial or intracranial fluid collection. Vascular: No hyperdense vessel or unexpected calcification. Skull: Normal. Negative for fracture or focal lesion. Sinuses/Orbits: Minimal opacification of lower right mastoid air cells. Mastoid air cells otherwise clear. Paranasal sinuses are clear. Other: None. IMPRESSION: 1. No acute intracranial abnormality. 2. Minimal opacification of lower right mastoid air cells. Electronically Signed   By: Keith Rake M.D.   On: 03/29/2021 19:54   MR ANGIO HEAD WO CONTRAST  Result Date: 03/31/2021 CLINICAL DATA:  Stroke follow-up EXAM: MRA HEAD WITHOUT CONTRAST TECHNIQUE: Angiographic images of the Circle of Willis were acquired using MRA technique without intravenous contrast. COMPARISON:  MRI head 03/30/2021 FINDINGS: Anterior circulation: Atherosclerotic irregularity in the internal carotid artery through the skull base and cavernous segment without significant stenosis. Anterior and middle cerebral arteries patent without stenosis or large vessel occlusion. Posterior circulation: Left vertebral artery dominant. Mild stenosis distal right vertebral artery. PICA patent bilaterally. Basilar widely patent. Superior cerebellar and posterior cerebral arteries patent bilaterally. Anatomic variants: Negative for cerebral aneurysm Other: Motion degraded study IMPRESSION: Motion degraded study.  No flow limiting stenosis or large vessel occlusion. Electronically Signed   By: Franchot Gallo M.D.   On: 03/31/2021 17:26   MR BRAIN WO CONTRAST  Result Date: 03/30/2021 CLINICAL DATA:  75 year old male with slurred speech and left arm numbness. EXAM: MRI HEAD WITHOUT CONTRAST TECHNIQUE: Multiplanar, multiecho pulse sequences of the brain and surrounding structures were obtained without intravenous contrast. COMPARISON:  Head CT 03/29/2021.  Brain MRI  06/24/2016. FINDINGS: Brain: There are multiple small cortical foci of restricted diffusion in the right superior perirolandic and postcentral parietal cortex (series 5, images 36-39). No other right MCA territory restricted diffusion identified. Mild if any associated cortical T2 and FLAIR hyperintensity in the affected areas. Suggestion of petechial hemorrhage on series 12, image 44. But no malignant hemorrhagic transformation. No mass effect. Elsewhere no restricted diffusion to suggest acute infarction. No midline shift, mass effect, evidence of mass lesion, ventriculomegaly, extra-axial collection. Cervicomedullary junction and pituitary are within normal limits. Cerebral volume is within normal limits for age. Cavum septum pellucidum, normal variant. Widely scattered small white matter T2 and FLAIR hyperintense foci in both hemispheres have not significantly changed since 2017. Two small chronic lacunar infarcts in the right cerebellum are stable. No chronic cortical encephalomalacia identified. Chronic microhemorrhage in the left occipital lobe. Deep gray matter nuclei and brainstem remain within normal limits. Vascular: Major intracranial vascular flow voids are stable since 2017. Skull and upper cervical spine: Negative. Normal bone marrow signal. Sinuses/Orbits: Stable, negative. Other: Mastoids remain well aerated. Negative visible scalp and face. IMPRESSION: 1. Scattered small acute cortical infarcts in the posterior Right MCA territory. Minimal petechial hemorrhage. No malignant hemorrhagic transformation or mass effect. 2. Otherwise stable noncontrast MRI appearance of the brain since 2017, with evidence of chronic small vessel disease in the cerebral white matter and cerebellum. Electronically Signed   By: Genevie Ann M.D.   On: 03/30/2021 07:20   US Carotid Bilateral  Result Date: 03/30/2021 CLINICAL DATA:  75 year old male with TIA EXAM: BILATERAL CAROTID DUPLEX ULTRASOUND TECHNIQUE: Pearline Cables scale  imaging, color Doppler and duplex ultrasound were performed of bilateral carotid and vertebral arteries in the neck. COMPARISON:  None. FINDINGS: Criteria: Quantification of carotid stenosis is based on velocity parameters that correlate the residual internal carotid diameter with NASCET-based stenosis levels, using the diameter of the distal internal carotid lumen as the denominator for stenosis measurement. The following velocity measurements were obtained: RIGHT ICA:  Systolic 74 cm/sec, Diastolic 15 cm/sec CCA:  87 cm/sec SYSTOLIC ICA/CCA RATIO:  0.8 ECA:  89 cm/sec LEFT ICA:  Systolic 74 cm/sec, Diastolic 25 cm/sec CCA:  89 cm/sec SYSTOLIC ICA/CCA RATIO:  0.8 ECA:  66 cm/sec Right Brachial SBP: Not acquired Left Brachial SBP: Not acquired RIGHT CAROTID ARTERY: No significant calcified disease of the right common carotid artery. Intermediate waveform maintained. Heterogeneous plaque without significant calcifications at the right carotid bifurcation. Low resistance waveform of the right ICA. No significant tortuosity. RIGHT VERTEBRAL ARTERY: Antegrade flow with low resistance waveform. LEFT CAROTID ARTERY: No significant calcified disease of the left common carotid artery. Intermediate waveform maintained. Heterogeneous plaque at the left carotid bifurcation without significant calcifications. Low resistance waveform of the left ICA. LEFT VERTEBRAL ARTERY:  Antegrade flow with low resistance waveform. IMPRESSION: Color duplex indicates minimal heterogeneous plaque, with no hemodynamically significant stenosis by duplex criteria in the extracranial cerebrovascular circulation. Signed, Dulcy Fanny. Dellia Nims, RPVI [facility] Electronically Signed   By: Corrie Mckusick D.O.   On:  03/30/2021 09:27   ECHOCARDIOGRAM COMPLETE BUBBLE STUDY  Result Date: 03/31/2021    ECHOCARDIOGRAM REPORT   Patient Name:   James Preston [facility] Date of Exam: 03/31/2021 Medical Rec  #:  [id number]           Height:       67.0 in Accession #:    [id number]          Weight:       189.2 lb Date of Birth:  Apr 16, 1946           BSA:          1.975 m Patient Age:    25 years            BP:           117/70 mmHg Patient Gender: M                   HR:           51 bpm. Exam Location:  [facility] Procedure: 2D Echo, Cardiac Doppler, Color Doppler and Strain Analysis Indications:     TIA  History:         Patient has no prior history of Echocardiogram examinations.                  CAD; Risk Factors:Dyslipidemia.  Sonographer:     Sherrie Sport RDCS (AE) Referring Phys:  [id number] Rhetta Mura Diagnosing Phys: Bartholome Bill MD IMPRESSIONS  1. Left ventricular ejection fraction, by estimation, is 45 to 50%. The left ventricle has normal function. The left ventricle demonstrates global hypokinesis. Left ventricular diastolic parameters were normal.  2. Right ventricular systolic function is normal. The right ventricular size is normal.  3. Left atrial size was mildly dilated.  4. The mitral valve was not well visualized. Trivial mitral valve regurgitation.  5. The aortic valve is grossly normal. Aortic valve regurgitation is trivial.  6. Agitated saline contrast bubble study was negative, with no evidence of any interatrial shunt. FINDINGS  Left Ventricle: Left ventricular ejection fraction, by estimation, is 45 to 50%. The left ventricle has normal function. The left ventricle demonstrates global hypokinesis. The left ventricular internal cavity size was normal in size. There is no left ventricular hypertrophy. Left ventricular diastolic parameters were normal. Right Ventricle: The right ventricular size is normal. No increase in right ventricular wall thickness. Right ventricular systolic function is normal. Left Atrium: Left atrial size was mildly dilated. Right Atrium: Right atrial size was normal in size. Pericardium: There is no evidence of pericardial effusion. Mitral Valve: The mitral valve was not well  visualized. Trivial mitral valve regurgitation. Tricuspid Valve: The tricuspid valve is not well visualized. Tricuspid valve regurgitation is trivial. Aortic Valve: The aortic valve is grossly normal. Aortic valve regurgitation is trivial. Aortic valve mean gradient measures 2.0 mmHg. Aortic valve peak gradient measures 3.7 mmHg. Aortic valve area, by VTI measures 2.89 cm. Pulmonic Valve: The pulmonic valve was not well visualized. Pulmonic valve regurgitation is not visualized. Aorta: The aortic root is normal in size and structure. IAS/Shunts: No atrial level shunt detected by color flow Doppler. Agitated saline contrast was given intravenously to evaluate for intracardiac shunting. Agitated saline contrast bubble study was negative, with no evidence of any interatrial shunt.  LEFT VENTRICLE PLAX 2D LVIDd:         4.23 cm      Diastology LVIDs:         3.59 cm  LV e' medial:    6.42 cm/s LV PW:         1.18 cm      LV E/e' medial:  6.7 LV IVS:        0.85 cm      LV e' lateral:   6.96 cm/s LVOT diam:     2.10 cm      LV E/e' lateral: 6.2 LV SV:         57 LV SV Index:   29 LVOT Area:     3.46 cm  LV Volumes (MOD) LV vol d, MOD A2C: 78.3 ml LV vol d, MOD A4C: 111.0 ml LV vol s, MOD A2C: 41.2 ml LV vol s, MOD A4C: 63.5 ml LV SV MOD A2C:     37.1 ml LV SV MOD A4C:     111.0 ml LV SV MOD BP:      47.0 ml RIGHT VENTRICLE RV Basal diam:  3.14 cm RV S prime:     15.00 cm/s TAPSE (M-mode): 4.3 cm LEFT ATRIUM             Index       RIGHT ATRIUM           Index LA diam:        3.30 cm 1.67 cm/m  RA Area:     13.50 cm LA Vol (A2C):   27.2 ml 13.77 ml/m RA Volume:   32.50 ml  16.46 ml/m LA Vol (A4C):   23.1 ml 11.70 ml/m LA Biplane Vol: 26.1 ml 13.22 ml/m  AORTIC VALVE                   PULMONIC VALVE AV Area (Vmax):    3.11 cm    PV Vmax:        0.70 m/s AV Area (Vmean):   2.63 cm    PV Peak grad:   1.9 mmHg AV Area (VTI):     2.89 cm    RVOT Peak grad: 4 mmHg AV Vmax:           96.10 cm/s AV Vmean:           70.900 cm/s AV VTI:            0.198 m AV Peak Grad:      3.7 mmHg AV Mean Grad:      2.0 mmHg LVOT Vmax:         86.20 cm/s LVOT Vmean:        53.800 cm/s LVOT VTI:          0.165 m LVOT/AV VTI ratio: 0.83  AORTA Ao Root diam: 2.80 cm MITRAL VALVE               TRICUSPID VALVE MV Area (PHT): 2.61 cm    TR Peak grad:   18.8 mmHg MV Decel Time: 291 msec    TR Vmax:        217.00 cm/s MV E velocity: 43.30 cm/s MV A velocity: 81.40 cm/s  SHUNTS MV E/A ratio:  0.53        Systemic VTI:  0.16 m                            Systemic Diam: 2.10 cm Bartholome Bill MD Electronically signed by Bartholome Bill MD Signature Date/Time: 03/31/2021/12:39:32 PM    Final    (Echo, Carotid, EGD, Colonoscopy, ERCP)    Subjective: Pt  denies any complaints   Discharge Exam: Vitals:   03/31/21 0743 03/31/21 1619  BP: 117/70 (!) 141/78  Pulse: (!) 51 60  Resp: 15 16  Temp: 97.9 F (36.6 C) 98.1 F (36.7 C)  SpO2: 97% 96%   Vitals:   03/31/21 0010 03/31/21 0438 03/31/21 0743 03/31/21 1619  BP: 107/61 117/72 117/70 (!) 141/78  Pulse: (!) 58 (!) 55 (!) 51 60  Resp: '20 16 15 16  '$ Temp: 97.7 F (36.5 C) (!) 97.4 F (36.3 C) 97.9 F (36.6 C) 98.1 F (36.7 C)  TempSrc: Oral     SpO2: 96% 97% 97% 96%  Weight:  85.8 kg    Height:        General: Pt is alert, awake, not in acute distress Cardiovascular: S1/S2 +, no rubs, no gallops Respiratory: CTA bilaterally, no wheezing, no rhonchi Abdominal: Soft, NT, ND, bowel sounds + Extremities: no edema, no cyanosis    The results of significant diagnostics from this hospitalization (including imaging, microbiology, ancillary and laboratory) are listed below for reference.     Microbiology: Recent Results (from the past 240 hour(s))  Resp Panel by RT-PCR (Flu A&B, Covid) Nasopharyngeal Swab     Status: None   Collection Time: 03/29/21 10:52 PM   Specimen: Nasopharyngeal Swab; Nasopharyngeal(NP) swabs in vial transport medium  Result Value Ref Range Status   SARS  Coronavirus 2 by RT PCR NEGATIVE NEGATIVE Final    Comment: (NOTE) SARS-CoV-2 target nucleic acids are NOT DETECTED.  The SARS-CoV-2 RNA is generally detectable in upper respiratory specimens during the acute phase of infection. The lowest concentration of SARS-CoV-2 viral copies this assay can detect is 138 copies/mL. A negative result does not preclude SARS-Cov-2 infection and should not be used as the sole basis for treatment or other patient management decisions. A negative result may occur with  improper specimen collection/handling, submission of specimen other than nasopharyngeal swab, presence of viral mutation(s) within the areas targeted by this assay, and inadequate number of viral copies(<138 copies/mL). A negative result must be combined with clinical observations, patient history, and epidemiological information. The expected result is Negative.  Fact Sheet for Patients:  EntrepreneurPulse.com.au  Fact Sheet for Healthcare Providers:  IncredibleEmployment.be  This test is no t yet approved or cleared by the [place] FDA and  has been authorized for detection and/or diagnosis of SARS-CoV-2 by FDA under an Emergency Use Authorization (EUA). This EUA will remain  in effect (meaning this test can be used) for the duration of the COVID-19 declaration under Section 564(b)(1) of the Act, 21 U.S.C.section 360bbb-3(b)(1), unless the authorization is terminated  or revoked sooner.       Influenza A by PCR NEGATIVE NEGATIVE Final   Influenza B by PCR NEGATIVE NEGATIVE Final    Comment: (NOTE) The Xpert Xpress SARS-CoV-2/FLU/RSV plus assay is intended as an aid in the diagnosis of influenza from Nasopharyngeal swab specimens and should not be used as a sole basis for treatment. Nasal washings and aspirates are unacceptable for Xpert Xpress SARS-CoV-2/FLU/RSV testing.  Fact Sheet for  Patients: EntrepreneurPulse.com.au  Fact Sheet for Healthcare Providers: IncredibleEmployment.be  This test is not yet approved or cleared by the [place] FDA and has been authorized for detection and/or diagnosis of SARS-CoV-2 by FDA under an Emergency Use Authorization (EUA). This EUA will remain in effect (meaning this test can be used) for the duration of the COVID-19 declaration under Section 564(b)(1) of the Act, 21 U.S.C. section 360bbb-3(b)(1), unless the authorization  is terminated or revoked.  Performed at [facility], [address] [place], [place] [address]      Labs: BNP (last 3 results) No results for input(s): BNP in the last 8760 hours. Basic Metabolic Panel: Recent Labs  Lab 03/29/21 1845 03/30/21 0711 03/31/21 0633  NA 135 138 138  K 4.2 4.6 4.7  CL 105 103 105  CO2 '26 28 24  '$ GLUCOSE 106* 102* 105*  BUN 36* 30* 27*  CREATININE 1.73* 1.55* 1.49*  CALCIUM 9.0 9.2 9.3  MG 2.3 2.2  --    Liver Function Tests: Recent Labs  Lab 03/30/21 0711  AST 21  ALT 20  ALKPHOS 60  BILITOT 0.9  PROT 6.8  ALBUMIN 4.0   No results for input(s): LIPASE, AMYLASE in the last 168 hours. No results for input(s): AMMONIA in the last 168 hours. CBC: Recent Labs  Lab 03/29/21 1845 03/30/21 0711 03/31/21 0633  WBC 7.5 7.2 6.8  NEUTROABS 4.7  --   --   HGB 11.9* 13.2 13.2  HCT 34.5* 40.3 37.6*  MCV 88.5 87.4 87.0  PLT 154 164 144*   Cardiac Enzymes: No results for input(s): CKTOTAL, CKMB, CKMBINDEX, TROPONINI in the last 168 hours. BNP: Invalid input(s): POCBNP CBG: No results for input(s): GLUCAP in the last 168 hours. D-Dimer No results for input(s): DDIMER in the last 72 hours. Hgb A1c Recent Labs    03/30/21 0711  HGBA1C 6.4*   Lipid Profile Recent Labs    03/30/21 0711  CHOL 153  HDL 38*  LDLCALC 96  TRIG 95  CHOLHDL 4.0   Thyroid function studies No results for input(s): TSH,  T4TOTAL, T3FREE, THYROIDAB in the last 72 hours.  Invalid input(s): FREET3 Anemia work up No results for input(s): VITAMINB12, FOLATE, FERRITIN, TIBC, IRON, RETICCTPCT in the last 72 hours. Urinalysis No results found for: COLORURINE, APPEARANCEUR, [place], [place], GLUCOSEU, [place], [place], [place], PROTEINUR, UROBILINOGEN, NITRITE, LEUKOCYTESUR Sepsis Labs Invalid input(s): PROCALCITONIN,  WBC,  LACTICIDVEN Microbiology Recent Results (from the past 240 hour(s))  Resp Panel by RT-PCR (Flu A&B, Covid) Nasopharyngeal Swab     Status: None   Collection Time: 03/29/21 10:52 PM   Specimen: Nasopharyngeal Swab; Nasopharyngeal(NP) swabs in vial transport medium  Result Value Ref Range Status   SARS Coronavirus 2 by RT PCR NEGATIVE NEGATIVE Final    Comment: (NOTE) SARS-CoV-2 target nucleic acids are NOT DETECTED.  The SARS-CoV-2 RNA is generally detectable in upper respiratory specimens during the acute phase of infection. The lowest concentration of SARS-CoV-2 viral copies this assay can detect is 138 copies/mL. A negative result does not preclude SARS-Cov-2 infection and should not be used as the sole basis for treatment or other patient management decisions. A negative result may occur with  improper specimen collection/handling, submission of specimen other than nasopharyngeal swab, presence of viral mutation(s) within the areas targeted by this assay, and inadequate number of viral copies(<138 copies/mL). A negative result must be combined with clinical observations, patient history, and epidemiological information. The expected result is Negative.  Fact Sheet for Patients:  EntrepreneurPulse.com.au  Fact Sheet for Healthcare Providers:  IncredibleEmployment.be  This test is no t yet approved or cleared by the [place] FDA and  has been authorized for detection and/or diagnosis of SARS-CoV-2 by FDA under an Emergency Use  Authorization (EUA). This EUA will remain  in effect (meaning this test can be used) for the duration of the COVID-19 declaration under Section 564(b)(1) of the Act, 21 U.S.C.section  360bbb-3(b)(1), unless the authorization is terminated  or revoked sooner.       Influenza A by PCR NEGATIVE NEGATIVE Final   Influenza B by PCR NEGATIVE NEGATIVE Final    Comment: (NOTE) The Xpert Xpress SARS-CoV-2/FLU/RSV plus assay is intended as an aid in the diagnosis of influenza from Nasopharyngeal swab specimens and should not be used as a sole basis for treatment. Nasal washings and aspirates are unacceptable for Xpert Xpress SARS-CoV-2/FLU/RSV testing.  Fact Sheet for Patients: EntrepreneurPulse.com.au  Fact Sheet for Healthcare Providers: IncredibleEmployment.be  This test is not yet approved or cleared by the [place] FDA and has been authorized for detection and/or diagnosis of SARS-CoV-2 by FDA under an Emergency Use Authorization (EUA). This EUA will remain in effect (meaning this test can be used) for the duration of the COVID-19 declaration under Section 564(b)(1) of the Act, 21 U.S.C. section 360bbb-3(b)(1), unless the authorization is terminated or revoked.  Performed at [facility], [address], [place], [place] [address]      Time coordinating discharge: Over 30 minutes  SIGNED:   Wyvonnia Dusky, MD  [facility] 03/31/2021, 5:44 PM Pager   If 7PM-7AM, please contact night-coverage

## 2021-03-31 NOTE — TOC Initial Note (Signed)
Transition of Care [facility]) - Initial/Assessment Note    Patient Details  Name: James Preston MRN: [id number] Date of Birth: 1945/12/14  Transition of Care [facility]) CM/SW Contact:    Shelbie Hutching, RN Phone Number: 03/31/2021, 1:14 PM  Clinical Narrative:                 Patient placed under observation for TIA, workup discovered stroke.  RNCM met with patient and patient's wife at the bedside.  Patient is from home with his wife and independent.  PT has recommended outpatient therapy but patient prefers to see how he does at home and if he feels he needs therapy after getting home he will contact his PCP.  Patient is current with PCP Dr. Loney Hering, he gets prescriptions from [facility].   Wife will be able to transport patient home at discharge.  Patient likely DC today.   Expected Discharge Plan: Home/Self Care Barriers to Discharge: Continued Medical Work up   Patient Goals and CMS Choice Patient states their goals for this hospitalization and ongoing recovery are:: Patient wants to go home this afternoon if possible      Expected Discharge Plan and Services Expected Discharge Plan: Home/Self Care       Living arrangements for the past 2 months: Single Family Home                 DME Arranged: N/A DME Agency: NA       HH Arranged: NA          Prior Living Arrangements/Services Living arrangements for the past 2 months: Single Family Home Lives with:: Spouse Patient language and need for interpreter reviewed:: Yes Do you feel safe going back to the place where you live?: Yes      Need for Family Participation in Patient Care: Yes (Comment) (stroke) Care giver support system in place?: Yes (comment) (wife)   Criminal Activity/Legal Involvement Pertinent to Current Situation/Hospitalization: No - Comment as needed  Activities of Daily Living Home Assistive Devices/Equipment: Eyeglasses ADL Screening (condition at time of admission) Patient's cognitive ability  adequate to safely complete daily activities?: Yes Is the patient deaf or have difficulty hearing?: No Does the patient have difficulty seeing, even when wearing glasses/contacts?: No Does the patient have difficulty concentrating, remembering, or making decisions?: No Patient able to express need for assistance with ADLs?: Yes Does the patient have difficulty dressing or bathing?: No Independently performs ADLs?: Yes (appropriate for developmental age) Does the patient have difficulty walking or climbing stairs?: No Weakness of Legs: None Weakness of Arms/Hands: None  Permission Sought/Granted Permission sought to share information with : Case Manager, Family Supports Permission granted to share information with : Yes, Verbal Permission Granted              Emotional Assessment Appearance:: Appears stated age Attitude/Demeanor/Rapport: Engaged Affect (typically observed): Accepting Orientation: : Oriented to Self, Oriented to Place, Oriented to  Time, Oriented to Situation Alcohol / Substance Use: Not Applicable Psych Involvement: No (comment)  Admission diagnosis:  TIA (transient ischemic attack) [G45.9] Patient Active Problem List   Diagnosis Date Noted   Coronary artery disease    HLD (hyperlipidemia)    CKD (chronic kidney disease) stage 3, GFR 30-59 ml/min (HCC)    TIA (transient ischemic attack) 03/29/2021   PCP:  Derinda Late, MD Pharmacy:   [id number] - [place], [place] - [address] AT [address]  [place] [address] Phone: [phone] Fax: [id number] [address], [place] [address] Phone: [phone] Fax: [phone]     Social Determinants of Health (SDOH) Interventions    Readmission Risk Interventions No flowsheet data found.

## 2021-03-31 NOTE — Progress Notes (Signed)
Patient is being discharged home to self care with follow-up outpatient appointments. Ivs have been removed, discharge instructions explained and copy given to patient. Wife is at bedside for education and patient transportation home.

## 2021-03-31 NOTE — Progress Notes (Signed)
*  PRELIMINARY RESULTS* Echocardiogram 2D Echocardiogram has been performed.  Sherrie Sport 03/31/2021, 12:30 PM

## 2021-04-06 DIAGNOSIS — E7800 Pure hypercholesterolemia, unspecified: Secondary | ICD-10-CM | POA: Diagnosis not present

## 2021-04-06 DIAGNOSIS — I6930 Unspecified sequelae of cerebral infarction: Secondary | ICD-10-CM | POA: Diagnosis not present

## 2021-06-09 DIAGNOSIS — Z23 Encounter for immunization: Secondary | ICD-10-CM | POA: Diagnosis not present

## 2021-07-07 DIAGNOSIS — Z79899 Other long term (current) drug therapy: Secondary | ICD-10-CM | POA: Diagnosis not present

## 2021-07-07 DIAGNOSIS — E7800 Pure hypercholesterolemia, unspecified: Secondary | ICD-10-CM | POA: Diagnosis not present

## 2021-07-07 DIAGNOSIS — E1122 Type 2 diabetes mellitus with diabetic chronic kidney disease: Secondary | ICD-10-CM | POA: Diagnosis not present

## 2021-07-07 DIAGNOSIS — N1831 Chronic kidney disease, stage 3a: Secondary | ICD-10-CM | POA: Diagnosis not present

## 2021-07-12 DIAGNOSIS — Z125 Encounter for screening for malignant neoplasm of prostate: Secondary | ICD-10-CM | POA: Diagnosis not present

## 2021-07-12 DIAGNOSIS — I2510 Atherosclerotic heart disease of native coronary artery without angina pectoris: Secondary | ICD-10-CM | POA: Diagnosis not present

## 2021-07-12 DIAGNOSIS — E1122 Type 2 diabetes mellitus with diabetic chronic kidney disease: Secondary | ICD-10-CM | POA: Diagnosis not present

## 2021-07-12 DIAGNOSIS — E785 Hyperlipidemia, unspecified: Secondary | ICD-10-CM | POA: Diagnosis not present

## 2021-07-12 DIAGNOSIS — N189 Chronic kidney disease, unspecified: Secondary | ICD-10-CM | POA: Diagnosis not present

## 2021-07-23 DIAGNOSIS — Z01818 Encounter for other preprocedural examination: Secondary | ICD-10-CM | POA: Diagnosis not present

## 2021-07-23 DIAGNOSIS — H25013 Cortical age-related cataract, bilateral: Secondary | ICD-10-CM | POA: Diagnosis not present

## 2021-07-28 DIAGNOSIS — H52221 Regular astigmatism, right eye: Secondary | ICD-10-CM | POA: Diagnosis not present

## 2021-07-28 DIAGNOSIS — H2511 Age-related nuclear cataract, right eye: Secondary | ICD-10-CM | POA: Diagnosis not present

## 2021-07-28 DIAGNOSIS — H2512 Age-related nuclear cataract, left eye: Secondary | ICD-10-CM | POA: Diagnosis not present

## 2021-08-04 DIAGNOSIS — H2512 Age-related nuclear cataract, left eye: Secondary | ICD-10-CM | POA: Diagnosis not present

## 2021-08-04 DIAGNOSIS — N183 Chronic kidney disease, stage 3 unspecified: Secondary | ICD-10-CM | POA: Diagnosis not present

## 2021-08-04 DIAGNOSIS — I129 Hypertensive chronic kidney disease with stage 1 through stage 4 chronic kidney disease, or unspecified chronic kidney disease: Secondary | ICD-10-CM | POA: Diagnosis not present

## 2021-08-04 DIAGNOSIS — H52222 Regular astigmatism, left eye: Secondary | ICD-10-CM | POA: Diagnosis not present

## 2021-08-04 DIAGNOSIS — H2511 Age-related nuclear cataract, right eye: Secondary | ICD-10-CM | POA: Diagnosis not present

## 2021-08-10 DIAGNOSIS — E7800 Pure hypercholesterolemia, unspecified: Secondary | ICD-10-CM | POA: Diagnosis not present

## 2021-08-10 DIAGNOSIS — I2510 Atherosclerotic heart disease of native coronary artery without angina pectoris: Secondary | ICD-10-CM | POA: Diagnosis not present

## 2021-08-10 DIAGNOSIS — I10 Essential (primary) hypertension: Secondary | ICD-10-CM | POA: Diagnosis not present

## 2021-12-07 DIAGNOSIS — I255 Ischemic cardiomyopathy: Secondary | ICD-10-CM | POA: Diagnosis not present

## 2021-12-07 DIAGNOSIS — I10 Essential (primary) hypertension: Secondary | ICD-10-CM | POA: Diagnosis not present

## 2021-12-07 DIAGNOSIS — E1122 Type 2 diabetes mellitus with diabetic chronic kidney disease: Secondary | ICD-10-CM | POA: Diagnosis not present

## 2021-12-07 DIAGNOSIS — G459 Transient cerebral ischemic attack, unspecified: Secondary | ICD-10-CM | POA: Diagnosis not present

## 2021-12-07 DIAGNOSIS — E7800 Pure hypercholesterolemia, unspecified: Secondary | ICD-10-CM | POA: Diagnosis not present

## 2021-12-07 DIAGNOSIS — I2510 Atherosclerotic heart disease of native coronary artery without angina pectoris: Secondary | ICD-10-CM | POA: Diagnosis not present

## 2021-12-07 DIAGNOSIS — N1831 Chronic kidney disease, stage 3a: Secondary | ICD-10-CM | POA: Diagnosis not present

## 2021-12-30 DIAGNOSIS — I2510 Atherosclerotic heart disease of native coronary artery without angina pectoris: Secondary | ICD-10-CM | POA: Diagnosis not present

## 2022-01-10 DIAGNOSIS — I10 Essential (primary) hypertension: Secondary | ICD-10-CM | POA: Diagnosis not present

## 2022-01-10 DIAGNOSIS — E7800 Pure hypercholesterolemia, unspecified: Secondary | ICD-10-CM | POA: Diagnosis not present

## 2022-01-10 DIAGNOSIS — N1831 Chronic kidney disease, stage 3a: Secondary | ICD-10-CM | POA: Diagnosis not present

## 2022-01-10 DIAGNOSIS — G459 Transient cerebral ischemic attack, unspecified: Secondary | ICD-10-CM | POA: Diagnosis not present

## 2022-01-10 DIAGNOSIS — E1122 Type 2 diabetes mellitus with diabetic chronic kidney disease: Secondary | ICD-10-CM | POA: Diagnosis not present

## 2022-01-10 DIAGNOSIS — I2510 Atherosclerotic heart disease of native coronary artery without angina pectoris: Secondary | ICD-10-CM | POA: Diagnosis not present

## 2022-01-10 DIAGNOSIS — I255 Ischemic cardiomyopathy: Secondary | ICD-10-CM | POA: Diagnosis not present

## 2022-01-13 DIAGNOSIS — N1831 Chronic kidney disease, stage 3a: Secondary | ICD-10-CM | POA: Diagnosis not present

## 2022-01-13 DIAGNOSIS — Z79899 Other long term (current) drug therapy: Secondary | ICD-10-CM | POA: Diagnosis not present

## 2022-01-13 DIAGNOSIS — Z125 Encounter for screening for malignant neoplasm of prostate: Secondary | ICD-10-CM | POA: Diagnosis not present

## 2022-01-13 DIAGNOSIS — E7800 Pure hypercholesterolemia, unspecified: Secondary | ICD-10-CM | POA: Diagnosis not present

## 2022-01-13 DIAGNOSIS — E1122 Type 2 diabetes mellitus with diabetic chronic kidney disease: Secondary | ICD-10-CM | POA: Diagnosis not present

## 2022-01-20 DIAGNOSIS — Z1331 Encounter for screening for depression: Secondary | ICD-10-CM | POA: Diagnosis not present

## 2022-01-20 DIAGNOSIS — Z0000 Encounter for general adult medical examination without abnormal findings: Secondary | ICD-10-CM | POA: Diagnosis not present

## 2022-01-20 DIAGNOSIS — Z79899 Other long term (current) drug therapy: Secondary | ICD-10-CM | POA: Diagnosis not present

## 2022-02-09 DIAGNOSIS — H02834 Dermatochalasis of left upper eyelid: Secondary | ICD-10-CM | POA: Diagnosis not present

## 2022-02-09 DIAGNOSIS — H02835 Dermatochalasis of left lower eyelid: Secondary | ICD-10-CM | POA: Diagnosis not present

## 2022-02-09 DIAGNOSIS — Z8669 Personal history of other diseases of the nervous system and sense organs: Secondary | ICD-10-CM | POA: Diagnosis not present

## 2022-02-09 DIAGNOSIS — H02831 Dermatochalasis of right upper eyelid: Secondary | ICD-10-CM | POA: Diagnosis not present

## 2022-02-09 DIAGNOSIS — H02832 Dermatochalasis of right lower eyelid: Secondary | ICD-10-CM | POA: Diagnosis not present

## 2022-02-09 DIAGNOSIS — H57813 Brow ptosis, bilateral: Secondary | ICD-10-CM | POA: Diagnosis not present

## 2022-03-14 IMAGING — CT CT HEAD W/O CM
3 series · 16 of 47 positions shown, 19 images · non-contrast
Comparison: Brain MRI 06/24/2016

CLINICAL DATA: Left arm numbness.  Slurred speech.

EXAM:
CT HEAD WITHOUT CONTRAST
TECHNIQUE: Contiguous axial images were obtained from the base of the skull
through the vertex without intravenous contrast.

[Series 3: head wo · axial · 0.46mm/px · z∈[+451,+586]mm · 10 of 33 slices shown, 13 images]
[im 3/33  brain]
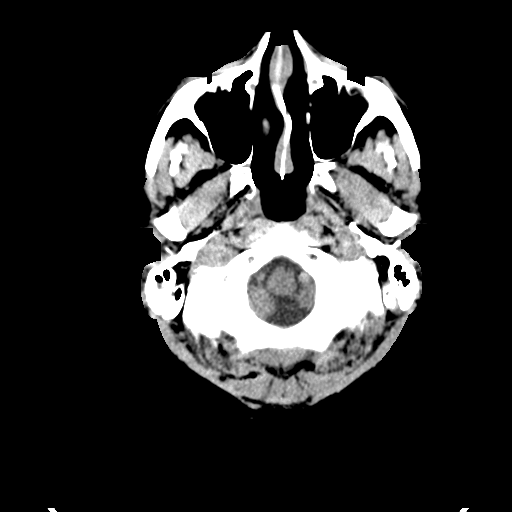
[im 3/33  bone]
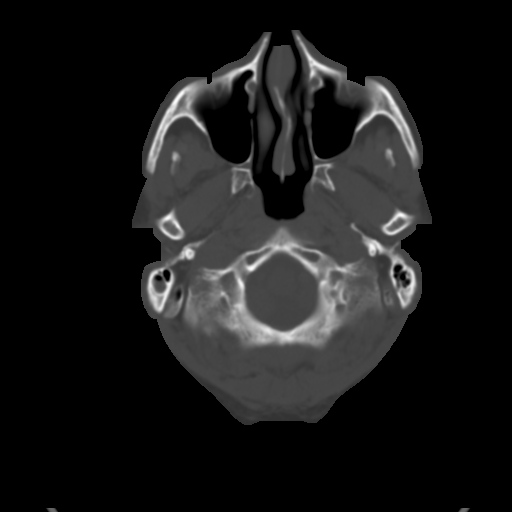
[im 6/33  brain]
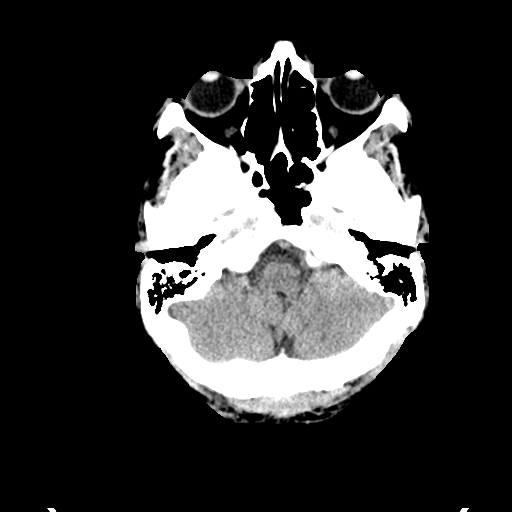
[im 9/33  brain]
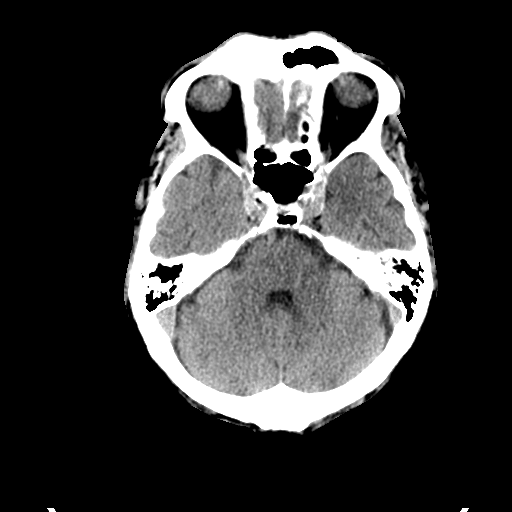
[im 12/33  brain]
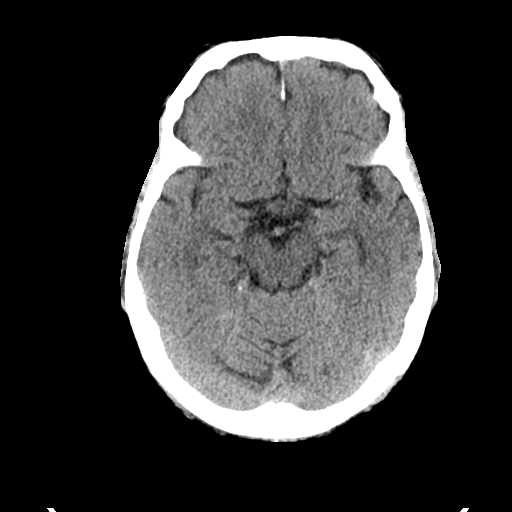
[im 15/33  brain]
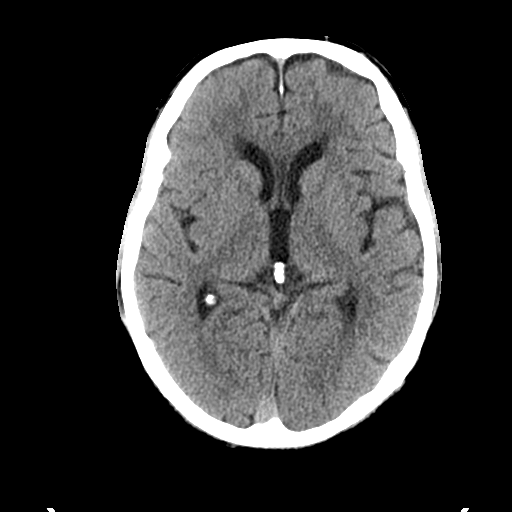
[im 15/33  bone]
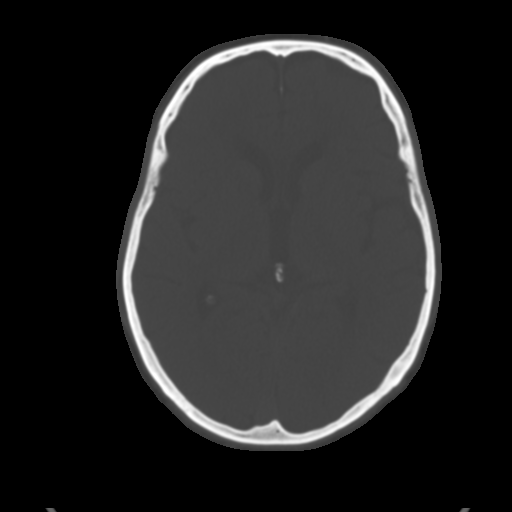
[im 18/33  brain]
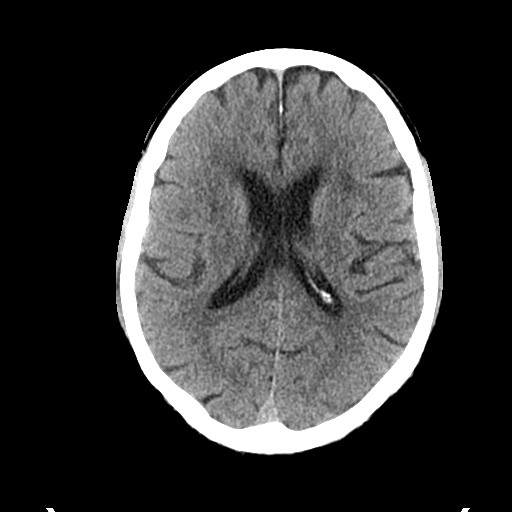
[im 21/33  brain]
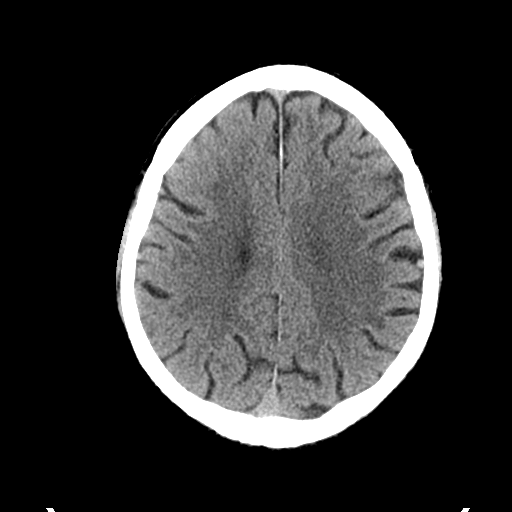
[im 25/33  brain]
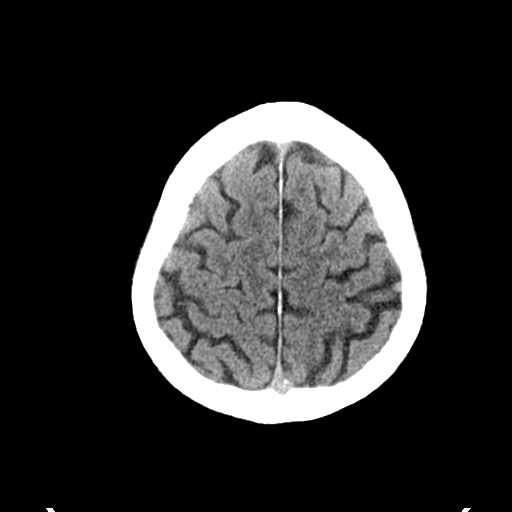
[im 27/33  brain]
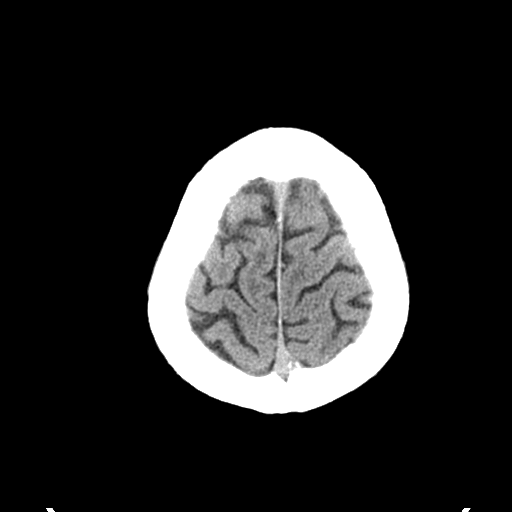
[im 27/33  bone]
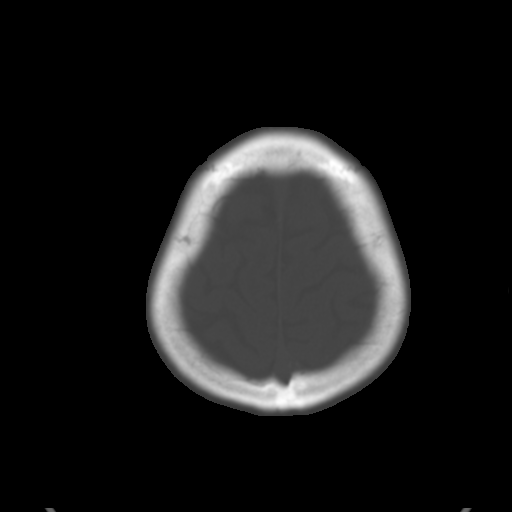
[im 30/33  brain]
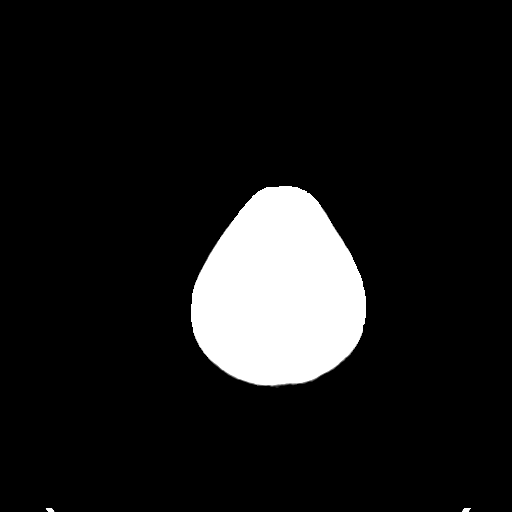

[Series 4: coronal soft tissue · coronal · 0.33mm/px · 3 of 71 slices shown]
[im 24/71  brain]
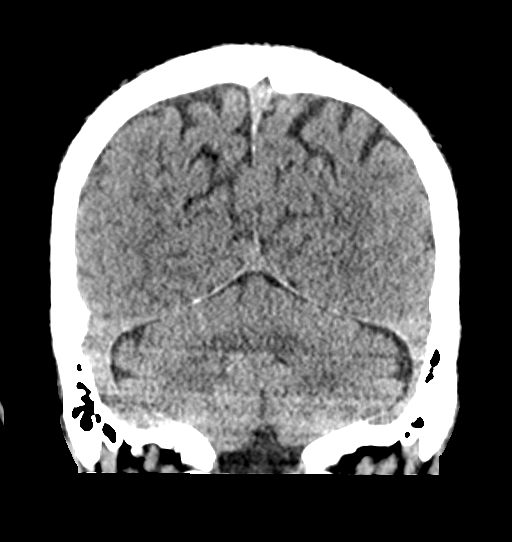
[im 32/71  brain]
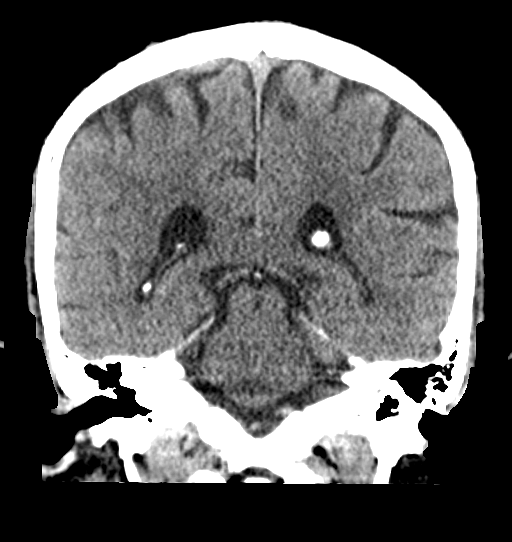
[im 39/71  brain]
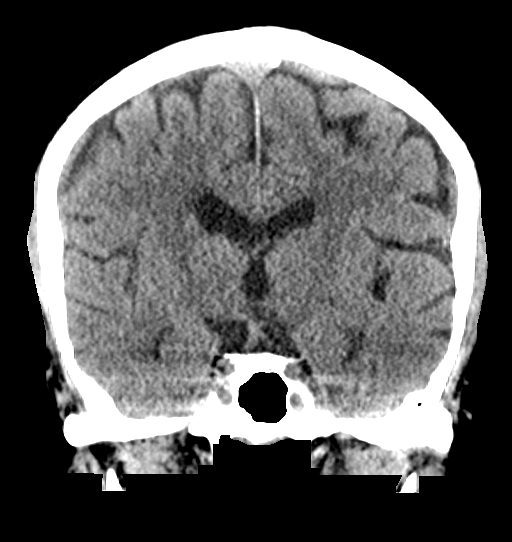

[Series 5: sagittal soft tissue · sagittal · 0.37mm/px · 3 of 57 slices shown]
[im 19/57  brain]
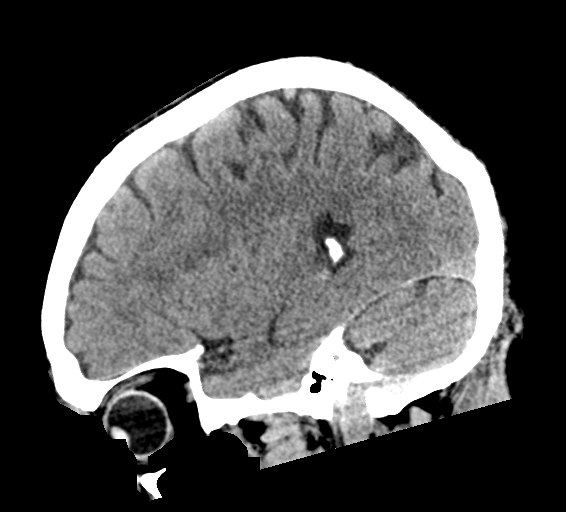
[im 29/57  brain]
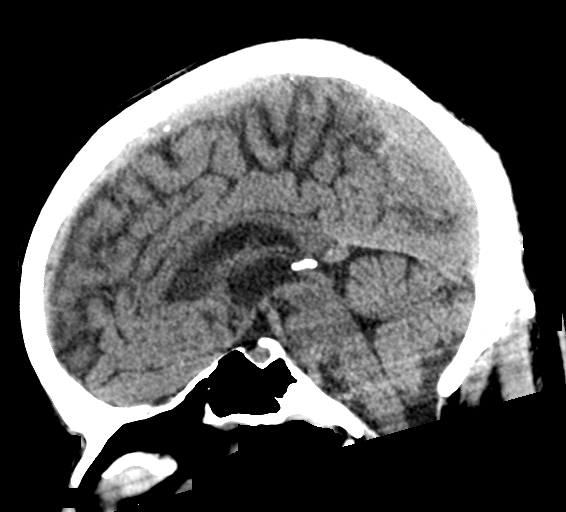
[im 38/57  brain]
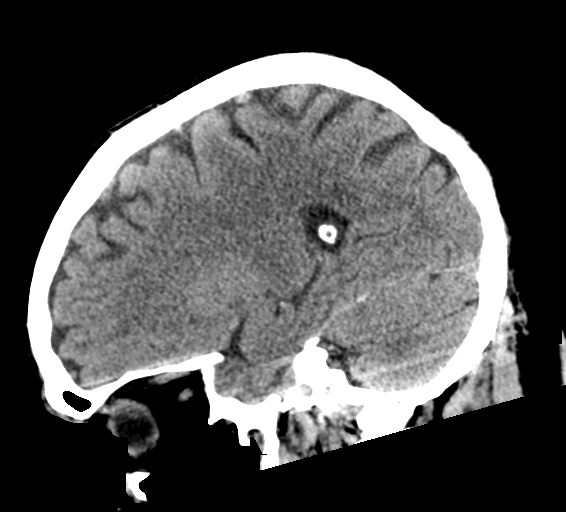

[16 of 47 positions shown; findings below may reference images not displayed]

FINDINGS: Brain: No intracranial hemorrhage, mass effect, or midline shift. No
hydrocephalus. The basilar cisterns are patent. No evidence of
territorial infarct or acute ischemia. Minimal periventricular
chronic small vessel ischemia, similar to prior MRI. No extra-axial
or intracranial fluid collection.

Vascular: No hyperdense vessel or unexpected calcification.

Skull: Normal. Negative for fracture or focal lesion.

Sinuses/Orbits: Minimal opacification of lower right mastoid air
cells. Mastoid air cells otherwise clear. Paranasal sinuses are
clear.

Other: None.
IMPRESSION: 1. No acute intracranial abnormality.
2. Minimal opacification of lower right mastoid air cells.

## 2022-03-15 IMAGING — US US CAROTID DUPLEX BILAT
1 series · 13 of 24 positions shown · non-contrast
Comparison: None.

CLINICAL DATA: 75-year-old male with TIA

EXAM:
BILATERAL CAROTID DUPLEX ULTRASOUND
TECHNIQUE: Gray scale imaging, color Doppler and duplex ultrasound were
performed of bilateral carotid and vertebral arteries in the neck.

[Series 1: us carotid bilateral · 13 of 64 slices shown]
[im 1/64]
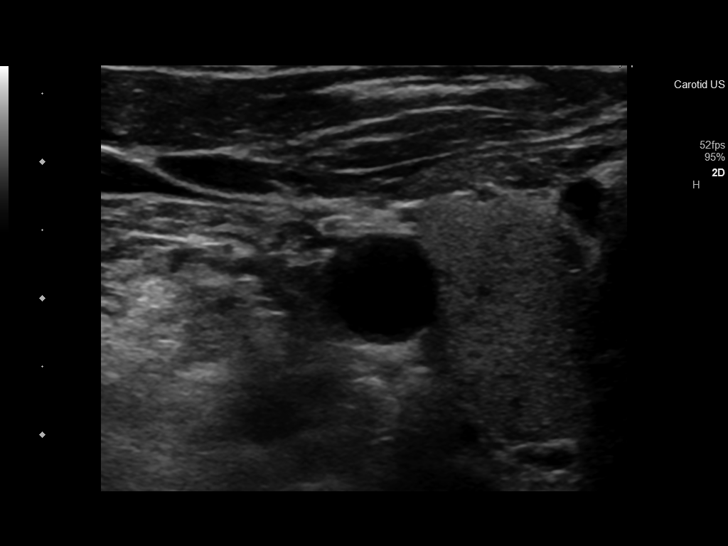
[im 6/64]
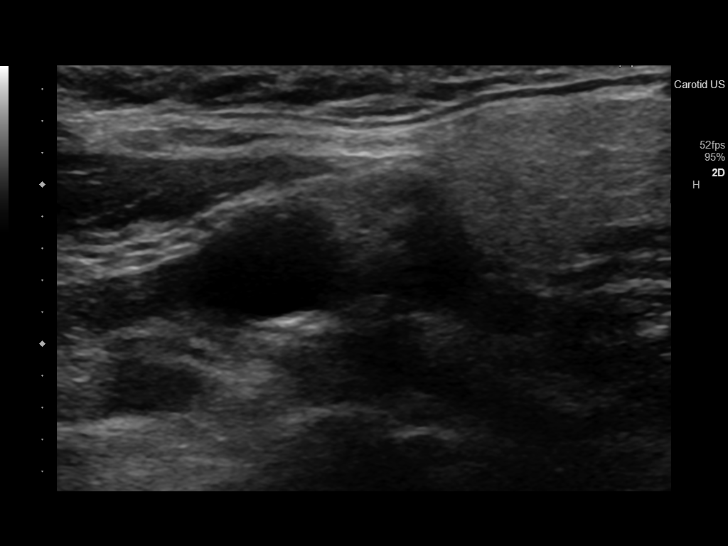
[im 11/64]
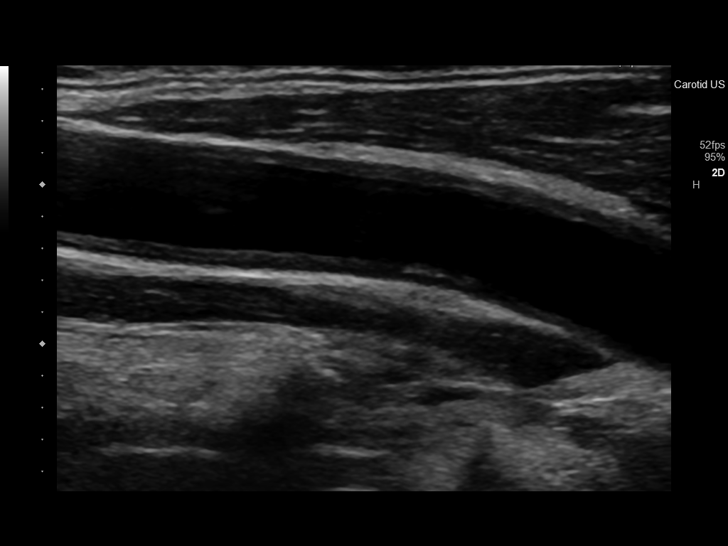
[im 17/64]
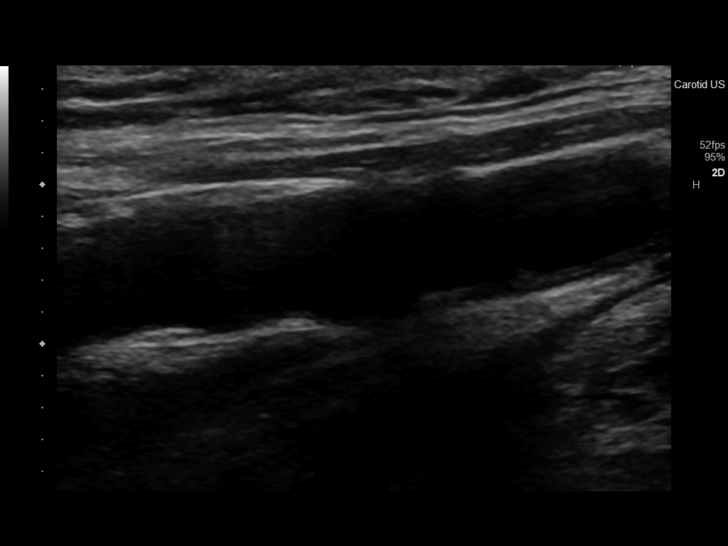
[im 22/64]
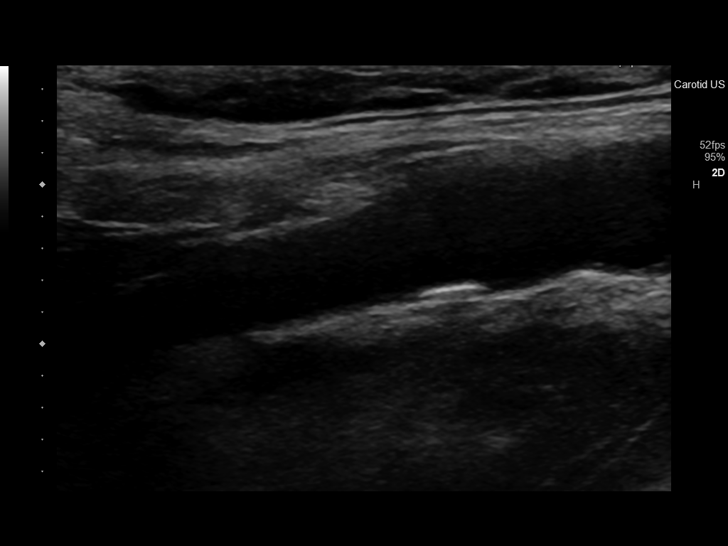
[im 28/64]
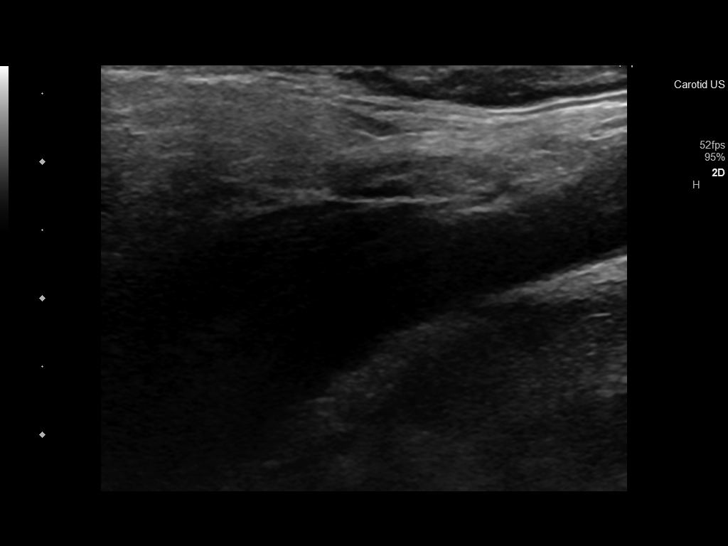
[im 33/64]
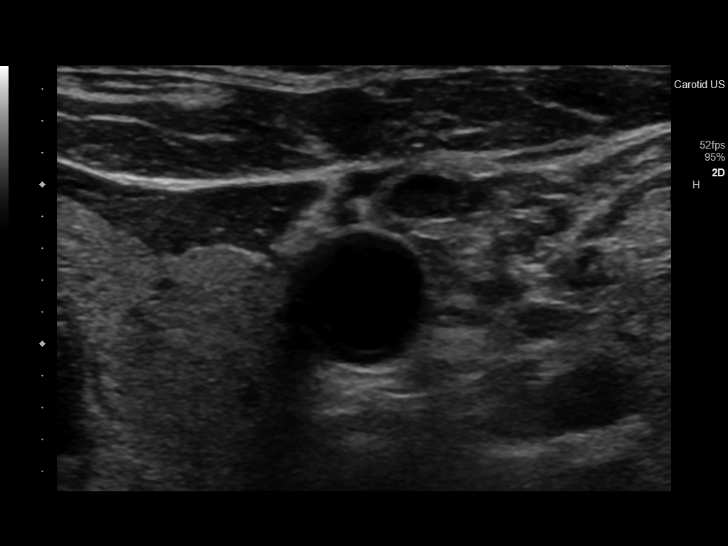
[im 36/64]
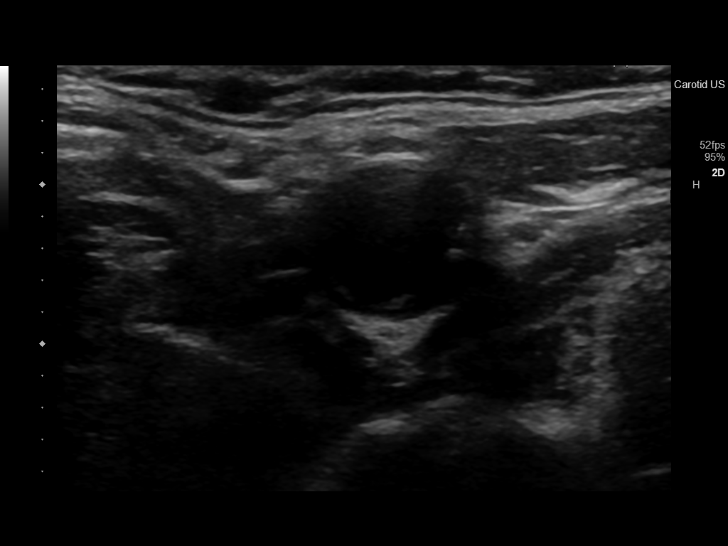
[im 42/64]
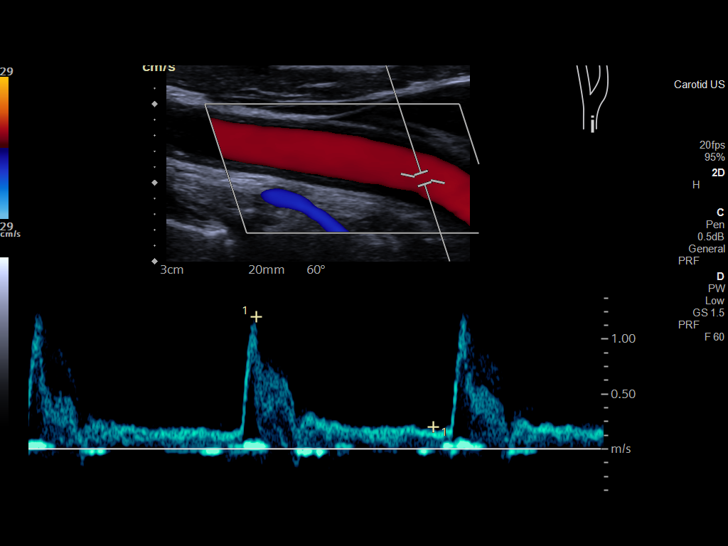
[im 47/64]
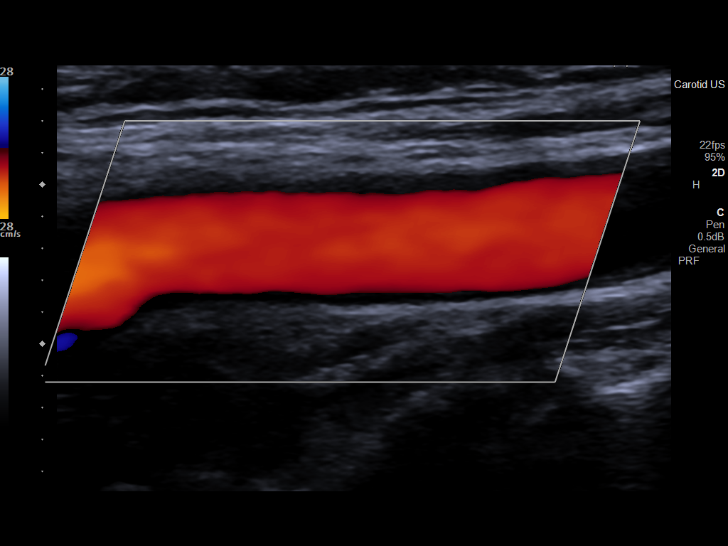
[im 53/64]
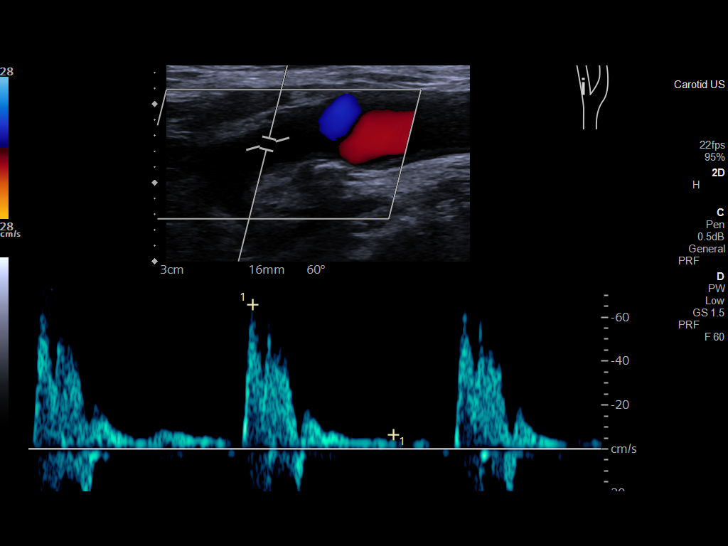
[im 58/64]
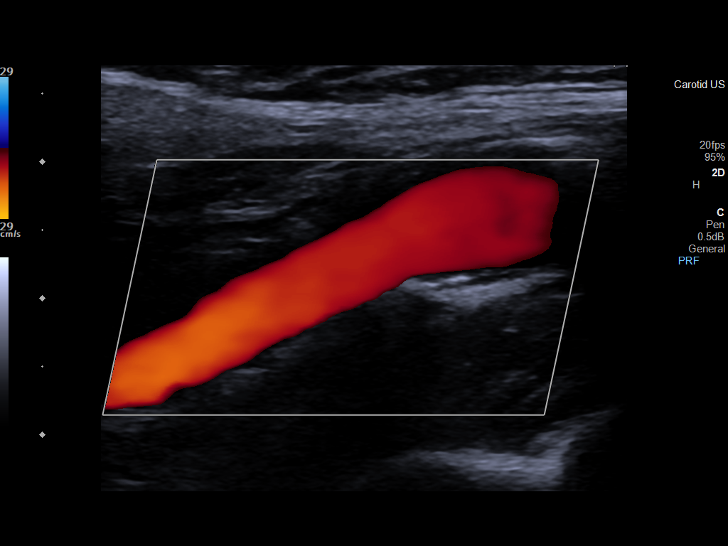
[im 64/64]
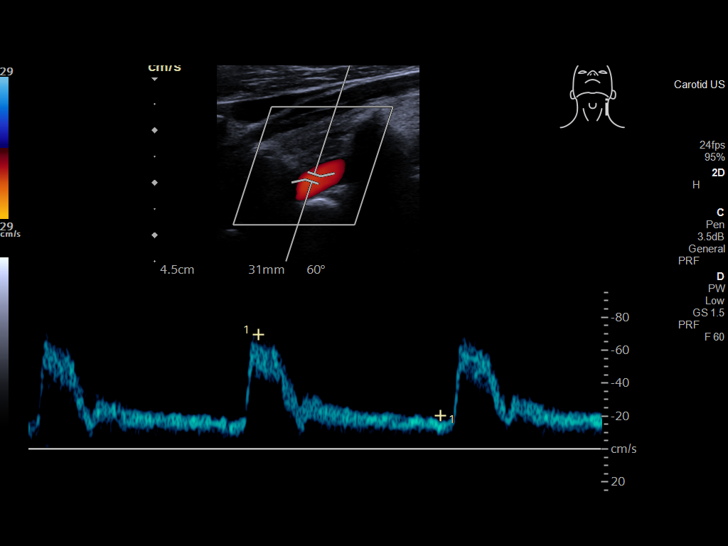

[13 of 24 positions shown; findings below may reference images not displayed]

FINDINGS: Criteria: Quantification of carotid stenosis is based on velocity
parameters that correlate the residual internal carotid diameter
with NASCET-based stenosis levels, using the diameter of the distal
internal carotid lumen as the denominator for stenosis measurement.

The following velocity measurements were obtained:

RIGHT

ICA:  Systolic 74 cm/sec, Diastolic 15 cm/sec

CCA:  87 cm/sec

SYSTOLIC ICA/CCA RATIO:

ECA:  89 cm/sec

LEFT

ICA:  Systolic 74 cm/sec, Diastolic 25 cm/sec

CCA:  89 cm/sec

SYSTOLIC ICA/CCA RATIO:

ECA:  66 cm/sec

Right Brachial SBP: Not acquired

Left Brachial SBP: Not acquired

RIGHT CAROTID ARTERY: No significant calcified disease of the right
common carotid artery. Intermediate waveform maintained.
Heterogeneous plaque without significant calcifications at the right
carotid bifurcation. Low resistance waveform of the right ICA. No
significant tortuosity.

RIGHT VERTEBRAL ARTERY: Antegrade flow with low resistance waveform.

LEFT CAROTID ARTERY: No significant calcified disease of the left
common carotid artery. Intermediate waveform maintained.
Heterogeneous plaque at the left carotid bifurcation without
significant calcifications. Low resistance waveform of the left ICA.

LEFT VERTEBRAL ARTERY:  Antegrade flow with low resistance waveform.
IMPRESSION: Color duplex indicates minimal heterogeneous plaque, with no
hemodynamically significant stenosis by duplex criteria in the
extracranial cerebrovascular circulation.

## 2022-03-18 DIAGNOSIS — R195 Other fecal abnormalities: Secondary | ICD-10-CM | POA: Diagnosis not present

## 2022-03-18 DIAGNOSIS — Z7902 Long term (current) use of antithrombotics/antiplatelets: Secondary | ICD-10-CM | POA: Diagnosis not present

## 2022-03-18 DIAGNOSIS — Z862 Personal history of diseases of the blood and blood-forming organs and certain disorders involving the immune mechanism: Secondary | ICD-10-CM | POA: Diagnosis not present

## 2022-03-31 ENCOUNTER — Ambulatory Visit
Admission: RE | Admit: 2022-03-31 | Discharge: 2022-03-31 | Disposition: A | Discharge status: Home / Self Care | Payer: Medicare HMO | Attending: Gastroenterology | Admitting: Gastroenterology

## 2022-03-31 ENCOUNTER — Encounter
Admission: RE | Disposition: A | Discharge status: Home / Self Care | Payer: Self-pay | Source: Home / Self Care | Attending: Gastroenterology

## 2022-03-31 ENCOUNTER — Other Ambulatory Visit: Payer: Self-pay

## 2022-03-31 ENCOUNTER — Ambulatory Visit: Payer: Medicare HMO | Admitting: Certified Registered"

## 2022-03-31 ENCOUNTER — Encounter: Payer: Self-pay | Admitting: Gastroenterology

## 2022-03-31 DIAGNOSIS — D122 Benign neoplasm of ascending colon: Secondary | ICD-10-CM | POA: Insufficient documentation

## 2022-03-31 DIAGNOSIS — I2510 Atherosclerotic heart disease of native coronary artery without angina pectoris: Secondary | ICD-10-CM | POA: Insufficient documentation

## 2022-03-31 DIAGNOSIS — K635 Polyp of colon: Secondary | ICD-10-CM | POA: Diagnosis not present

## 2022-03-31 DIAGNOSIS — Z7902 Long term (current) use of antithrombotics/antiplatelets: Secondary | ICD-10-CM | POA: Diagnosis not present

## 2022-03-31 DIAGNOSIS — R195 Other fecal abnormalities: Secondary | ICD-10-CM | POA: Insufficient documentation

## 2022-03-31 DIAGNOSIS — Z955 Presence of coronary angioplasty implant and graft: Secondary | ICD-10-CM | POA: Diagnosis not present

## 2022-03-31 DIAGNOSIS — D123 Benign neoplasm of transverse colon: Secondary | ICD-10-CM | POA: Insufficient documentation

## 2022-03-31 DIAGNOSIS — K648 Other hemorrhoids: Secondary | ICD-10-CM | POA: Diagnosis not present

## 2022-03-31 DIAGNOSIS — Z8673 Personal history of transient ischemic attack (TIA), and cerebral infarction without residual deficits: Secondary | ICD-10-CM | POA: Insufficient documentation

## 2022-03-31 DIAGNOSIS — K641 Second degree hemorrhoids: Secondary | ICD-10-CM | POA: Diagnosis not present

## 2022-03-31 DIAGNOSIS — Z1211 Encounter for screening for malignant neoplasm of colon: Secondary | ICD-10-CM | POA: Insufficient documentation

## 2022-03-31 DIAGNOSIS — D120 Benign neoplasm of cecum: Secondary | ICD-10-CM | POA: Diagnosis not present

## 2022-03-31 DIAGNOSIS — D124 Benign neoplasm of descending colon: Secondary | ICD-10-CM | POA: Insufficient documentation

## 2022-03-31 HISTORY — PX: COLONOSCOPY: SHX5424

## 2022-03-31 SURGERY — COLONOSCOPY
Anesthesia: General

## 2022-03-31 MED ORDER — PROPOFOL 500 MG/50ML IV EMUL
INTRAVENOUS | Status: DC | PRN
  Administered 2022-03-31: 120 ug/kg/min via INTRAVENOUS

## 2022-03-31 MED ORDER — EPHEDRINE SULFATE (PRESSORS) 50 MG/ML IJ SOLN
INTRAMUSCULAR | Status: DC | PRN
  Administered 2022-03-31: 10 mg via INTRAVENOUS

## 2022-03-31 MED ORDER — SODIUM CHLORIDE 0.9 % IV SOLN: INTRAVENOUS | Status: DC

## 2022-03-31 MED ORDER — LIDOCAINE 2% (20 MG/ML) 5 ML SYRINGE
INTRAMUSCULAR | Status: DC | PRN
  Administered 2022-03-31: 20 mg via INTRAVENOUS

## 2022-03-31 MED ORDER — PROPOFOL 10 MG/ML IV BOLUS
INTRAVENOUS | Status: DC | PRN
  Administered 2022-03-31: 70 mg via INTRAVENOUS

## 2022-03-31 NOTE — H&P (Signed)
Pre-Procedure H&P   Patient ID: James Preston is a 76 y.o. male.  Gastroenterology Provider: Annamaria Helling, DO  Referring Provider: Octavia Bruckner, PA PCP: Derinda Late, MD  Date: 03/31/2022  HPI James Preston is a 76 y.o. male who presents today for Colonoscopy for Positive Cologuard test.  Patient had a positive Cologuard test in June 2022.  He was scheduled undergo colonoscopy, however, he suffered a cerebrovascular accident which prevented him from doing so.  He is currently on Plavix which has been held for this procedure (5 days).  Patient reports that his bowel movements while loose have been overall regular occurring 1-2 times a day.  He denies melena or hematochezia weight or appetite changes.  Last colonoscopy per documentation is 2011  Most recent labs hemoglobin 13.1 MCV 89 platelets 158,000 creatinine 1.5 A1c 6.4  No family history of colon cancer or colon polyps  Patient with history of CAD status post PCI with stent x 3.  STEMI in 2010   Past Medical History:  Diagnosis Date   Coronary artery disease    HLD (hyperlipidemia)     Past Surgical History:  Procedure Laterality Date   CARDIAC SURGERY     CORONARY ANGIOPLASTY WITH STENT PLACEMENT  2003   x3   HERNIA REPAIR      Family History No h/o GI disease or malignancy  Review of Systems  Constitutional:  Negative for activity change, appetite change, chills, diaphoresis, fatigue, fever and unexpected weight change.  HENT:  Negative for trouble swallowing and voice change.   Respiratory:  Negative for shortness of breath and wheezing.   Cardiovascular:  Negative for chest pain, palpitations and leg swelling.  Gastrointestinal:  Negative for abdominal distention, abdominal pain, anal bleeding, blood in stool, constipation, diarrhea, nausea and vomiting.  Musculoskeletal:  Negative for arthralgias and myalgias.  Skin:  Negative for color change and pallor.  Neurological:   Negative for dizziness, syncope and weakness.  Psychiatric/Behavioral:  Negative for confusion. The patient is not nervous/anxious.   All other systems reviewed and are negative.    Medications No current facility-administered medications on file prior to encounter.   Current Outpatient Medications on File Prior to Encounter  Medication Sig Dispense Refill   clopidogrel (PLAVIX) 75 MG tablet Take 75 mg by mouth daily.     lisinopril (ZESTRIL) 5 MG tablet Take 5 mg by mouth daily.     metoprolol succinate (TOPROL-XL) 50 MG 24 hr tablet Take 50 mg by mouth daily.     simvastatin (ZOCOR) 40 MG tablet Take 40 mg by mouth at bedtime.     aspirin 81 MG EC tablet Take 81 mg by mouth daily. (Patient not taking: Reported on 03/31/2022)      Pertinent medications related to GI and procedure were reviewed by me with the patient prior to the procedure   Current Facility-Administered Medications:    0.9 %  sodium chloride infusion, , Intravenous, Continuous, Annamaria Helling, DO, Last Rate: 20 mL/hr at 03/31/22 1011, New Bag at 03/31/22 1011      No Known Allergies Allergies were reviewed by me prior to the procedure  Objective   Body mass index is 27.22 kg/m. Vitals:   03/31/22 1003  BP: 119/64  Pulse: (!) 52  Resp: 16  Temp: (!) 96.8 F (36 C)  TempSrc: Temporal  SpO2: 100%  Weight: 81.2 kg  Height: '5\' 8"'$  (1.727 m)     Physical Exam Vitals and nursing note  reviewed.  Constitutional:      General: He is not in acute distress.    Appearance: Normal appearance. He is not ill-appearing, toxic-appearing or diaphoretic.  HENT:     Head: Normocephalic and atraumatic.     Nose: Nose normal.     Mouth/Throat:     Mouth: Mucous membranes are moist.     Pharynx: Oropharynx is clear.  Eyes:     General: No scleral icterus.    Extraocular Movements: Extraocular movements intact.  Cardiovascular:     Rate and Rhythm: Regular rhythm. Bradycardia present.     Heart sounds:  Normal heart sounds. No murmur heard.    No friction rub. No gallop.  Pulmonary:     Effort: Pulmonary effort is normal. No respiratory distress.     Breath sounds: Normal breath sounds. No wheezing, rhonchi or rales.  Abdominal:     General: Bowel sounds are normal. There is no distension.     Palpations: Abdomen is soft.     Tenderness: There is no abdominal tenderness. There is no guarding or rebound.  Musculoskeletal:     Cervical back: Neck supple.     Right lower leg: No edema.     Left lower leg: No edema.  Skin:    General: Skin is warm and dry.     Coloration: Skin is not jaundiced or pale.  Neurological:     General: No focal deficit present.     Mental Status: He is alert and oriented to person, place, and time. Mental status is at baseline.  Psychiatric:        Mood and Affect: Mood normal.        Behavior: Behavior normal.        Thought Content: Thought content normal.        Judgment: Judgment normal.      Assessment:  James Preston is a 76 y.o. male  who presents today for Colonoscopy for Positive Cologuard test.  Plan:  Colonoscopy with possible intervention today  Colonoscopy with possible biopsy, control of bleeding, polypectomy, and interventions as necessary has been discussed with the patient/patient representative. Informed consent was obtained from the patient/patient representative after explaining the indication, nature, and risks of the procedure including but not limited to death, bleeding, perforation, missed neoplasm/lesions, cardiorespiratory compromise, and reaction to medications. Opportunity for questions was given and appropriate answers were provided. Patient/patient representative has verbalized understanding is amenable to undergoing the procedure.   Annamaria Helling, DO  [facility]  Portions of the record may have been created with voice recognition software. Occasional wrong-word or 'sound-a-like'  substitutions may have occurred due to the inherent limitations of voice recognition software.  Read the chart carefully and recognize, using context, where substitutions may have occurred.

## 2022-03-31 NOTE — Interval H&P Note (Signed)
History and Physical Interval Note: Preprocedure H&P from 03/31/22  was reviewed and there was no interval change after seeing and examining the patient.  Written consent was obtained from the patient after discussion of risks, benefits, and alternatives. Patient has consented to proceed with Colonoscopy with possible intervention   03/31/2022 11:18 AM  James Preston  has presented today for surgery, with the diagnosis of Positive colorectal cancer screening using Cologuard test (R19.5).  The various methods of treatment have been discussed with the patient and family. After consideration of risks, benefits and other options for treatment, the patient has consented to  Procedure(s): COLONOSCOPY (N/A) as a surgical intervention.  The patient's history has been reviewed, patient examined, no change in status, stable for surgery.  I have reviewed the patient's chart and labs.  Questions were answered to the patient's satisfaction.     Annamaria Helling

## 2022-03-31 NOTE — Transfer of Care (Signed)
Immediate Anesthesia Transfer of Care Note  Patient: James Preston  Procedure(s) Performed: COLONOSCOPY  Patient Location: PACU  Anesthesia Type:General  Level of Consciousness: drowsy  Airway & Oxygen Therapy: Patient Spontanous Breathing  Post-op Assessment: Report given to RN and Post -op Vital signs reviewed and stable  Post vital signs: Reviewed and stable  Last Vitals:  Vitals Value Taken Time  BP 116/64 03/31/22 1156  Temp 35.8 1156  Pulse 59 03/31/22 1157  Resp 15 03/31/22 1157  SpO2 100 % 03/31/22 1157  Vitals shown include unvalidated device data.  Last Pain:  Vitals:   03/31/22 1003  TempSrc: Temporal  PainSc: 0-No pain         Complications: No notable events documented.

## 2022-03-31 NOTE — Anesthesia Preprocedure Evaluation (Signed)
Anesthesia Evaluation  Patient identified by MRN, date of birth, ID band Patient awake    Reviewed: Allergy & Precautions, NPO status , Patient's Chart, lab work & pertinent test results  History of Anesthesia Complications Negative for: history of anesthetic complications  Airway Mallampati: II  TM Distance: >3 FB Neck ROM: Full    Dental no notable dental hx. (+) Teeth Intact   Pulmonary neg pulmonary ROS, neg sleep apnea, neg COPD, Patient abstained from smoking.Not current smoker,    Pulmonary exam normal breath sounds clear to auscultation       Cardiovascular Exercise Tolerance: Good METS(-) hypertension+ CAD  (-) Past MI (-) dysrhythmias  Rhythm:Regular Rate:Normal - Systolic murmurs    Neuro/Psych TIAnegative psych ROS   GI/Hepatic neg GERD  ,(+)     (-) substance abuse  ,   Endo/Other  neg diabetes  Renal/GU CRFRenal disease     Musculoskeletal   Abdominal   Peds  Hematology   Anesthesia Other Findings Past Medical History: No date: Coronary artery disease No date: HLD (hyperlipidemia)  Reproductive/Obstetrics                             Anesthesia Physical Anesthesia Plan  ASA: 2  Anesthesia Plan: General   Post-op Pain Management: Minimal or no pain anticipated   Induction: Intravenous  PONV Risk Score and Plan: 2 and Propofol infusion, TIVA and Ondansetron  Airway Management Planned: Nasal Cannula  Additional Equipment: None  Intra-op Plan:   Post-operative Plan:   Informed Consent: I have reviewed the patients History and Physical, chart, labs and discussed the procedure including the risks, benefits and alternatives for the proposed anesthesia with the patient or authorized representative who has indicated his/her understanding and acceptance.     Dental advisory given  Plan Discussed with: CRNA and Surgeon  Anesthesia Plan Comments: (Discussed risks  of anesthesia with patient, including possibility of difficulty with spontaneous ventilation under anesthesia necessitating airway intervention, PONV, and rare risks such as cardiac or respiratory or neurological events, and allergic reactions. Discussed the role of CRNA in patient's perioperative care. Patient understands.)        Anesthesia Quick Evaluation

## 2022-03-31 NOTE — Op Note (Signed)
[facility] Patient Name: James Preston Procedure Date: 03/31/2022 11:19 AM MRN: [id number] Account #: [id number] Date of Birth: Apr 16, 1946 Admit Type: Outpatient Age: 76 Room: [facility] ENDO ROOM 1 Gender: Male Note Status: Finalized Instrument Name: Colonscope [id number] Procedure:             Colonoscopy Indications:           Positive Cologuard test Providers:             Annamaria Helling DO, DO Referring MD:          Caprice Renshaw MD (Referring MD) Medicines:             Monitored Anesthesia Care Complications:         No immediate complications. Estimated blood loss:                         Minimal. Procedure:             Pre-Anesthesia Assessment:                        - Prior to the procedure, a History and Physical was                         performed, and patient medications and allergies were                         reviewed. The patient is competent. The risks and                         benefits of the procedure and the sedation options and                         risks were discussed with the patient. All questions                         were answered and informed consent was obtained.                         Patient identification and proposed procedure were                         verified by the physician, the nurse, the anesthetist                         and the technician in the endoscopy suite. Mental                         Status Examination: alert and oriented. Airway                         Examination: normal oropharyngeal airway and neck                         mobility. Respiratory Examination: clear to                         auscultation. CV Examination: RRR, no murmurs, no S3  or S4. Prophylactic Antibiotics: The patient does not                         require prophylactic antibiotics. Prior                         Anticoagulants: The patient has taken Plavix                          (clopidogrel), last dose was 5 days prior to                         procedure. ASA Grade Assessment: III - A patient with                         severe systemic disease. After reviewing the risks and                         benefits, the patient was deemed in satisfactory                         condition to undergo the procedure. The anesthesia                         plan was to use monitored anesthesia care (MAC).                         Immediately prior to administration of medications,                         the patient was re-assessed for adequacy to receive                         sedatives. The heart rate, respiratory rate, oxygen                         saturations, blood pressure, adequacy of pulmonary                         ventilation, and response to care were monitored                         throughout the procedure. The physical status of the                         patient was re-assessed after the procedure.                        After obtaining informed consent, the colonoscope was                         passed under direct vision. Throughout the procedure,                         the patient's blood pressure, pulse, and oxygen                         saturations were monitored continuously. The  Colonoscope was introduced through the anus and                         advanced to the the terminal ileum, with                         identification of the appendiceal orifice and IC                         valve. The colonoscopy was performed without                         difficulty. The patient tolerated the procedure well.                         The quality of the bowel preparation was evaluated                         using the BBPS [facility] Bowel Preparation Scale) with                         scores of: Right Colon = 3 (entire mucosa seen well                         with no residual staining, small fragments of stool or                          opaque liquid), Transverse Colon = 3 (entire mucosa                         seen well with no residual staining, small fragments                         of stool or opaque liquid) and Left Colon = 2 (minor                         amount of residual staining, small fragments of stool                         and/or opaque liquid, but mucosa seen well). The total                         BBPS score equals 8. The quality of the bowel                         preparation was excellent. The terminal ileum,                         ileocecal valve, appendiceal orifice, and rectum were                         photographed. Findings:      The perianal and digital rectal examinations were normal. Pertinent       negatives include normal sphincter tone.      The terminal ileum appeared normal. Estimated blood loss: none.      Non-bleeding internal hemorrhoids were found during retroflexion. The  hemorrhoids were Grade II (internal hemorrhoids that prolapse but reduce       spontaneously). Estimated blood loss: none.      Six sessile polyps were found in the descending colon, transverse colon,       ascending colon (2) and cecum (2). The polyps were 1 to 2 mm in size.       These polyps were removed with a jumbo cold forceps. Resection and       retrieval were complete. Estimated blood loss was minimal.      The exam was otherwise without abnormality on direct and retroflexion       views. Impression:            - The examined portion of the ileum was normal.                        - Non-bleeding internal hemorrhoids.                        - Six 1 to 2 mm polyps in the descending colon, in the                         transverse colon, in the ascending colon and in the                         cecum, removed with a jumbo cold forceps. Resected and                         retrieved.                        - The examination was otherwise normal on direct and                         retroflexion  views. Recommendation:        - Patient has a contact number available for                         emergencies. The signs and symptoms of potential                         delayed complications were discussed with the patient.                         Return to normal activities tomorrow. Written                         discharge instructions were provided to the patient.                        - Discharge patient to home.                        - Resume previous diet.                        - Continue present medications.                        - No aspirin, ibuprofen, naproxen, or other  non-steroidal anti-inflammatory drugs for 5 days after                         polyp removal.                        - Resume Plavix (clopidogrel) at prior dose in 4 days.                         Refer to managing physician for further adjustment of                         therapy.                        - Return to GI office as previously scheduled. Procedure Code(s):     --- Professional ---                        [phone], Colonoscopy, flexible; with biopsy, single or                         multiple Diagnosis Code(s):     --- Professional ---                        K64.1, Second degree hemorrhoids                        K63.5, Polyp of colon                        R19.5, Other fecal abnormalities CPT copyright 2019 [employer]. All rights reserved. The codes documented in this report are preliminary and upon coder review may  be revised to meet current compliance requirements. Attending Participation:      I personally performed the entire procedure. Volney American, DO Annamaria Helling DO, DO 03/31/2022 11:57:20 AM This report has been signed electronically. Number of Addenda: 0 Note Initiated On: 03/31/2022 11:19 AM Scope Withdrawal Time: 0 hours 21 minutes 55 seconds  Total Procedure Duration: 0 hours 25 minutes 55 seconds  Estimated Blood Loss:   Estimated blood loss was minimal.      [facility]

## 2022-03-31 NOTE — Anesthesia Postprocedure Evaluation (Signed)
Anesthesia Post Note  Patient: James Preston  Procedure(s) Performed: COLONOSCOPY  Patient location during evaluation: Endoscopy Anesthesia Type: General Level of consciousness: awake and alert Pain management: pain level controlled Vital Signs Assessment: post-procedure vital signs reviewed and stable Respiratory status: spontaneous breathing, nonlabored ventilation, respiratory function stable and patient connected to nasal cannula oxygen Cardiovascular status: blood pressure returned to baseline and stable Postop Assessment: no apparent nausea or vomiting Anesthetic complications: no   No notable events documented.   Last Vitals:  Vitals:   03/31/22 1206 03/31/22 1216  BP: 122/63 125/66  Pulse: (!) 52 (!) 51  Resp: 15 18  Temp:    SpO2: 100% 98%    Last Pain:  Vitals:   03/31/22 1216  TempSrc:   PainSc: 0-No pain                 Arita Miss

## 2022-04-01 ENCOUNTER — Encounter: Payer: Self-pay | Admitting: Gastroenterology

## 2022-04-01 LAB — SURGICAL PATHOLOGY

## 2022-05-16 DIAGNOSIS — I255 Ischemic cardiomyopathy: Secondary | ICD-10-CM | POA: Diagnosis not present

## 2022-05-16 DIAGNOSIS — I2510 Atherosclerotic heart disease of native coronary artery without angina pectoris: Secondary | ICD-10-CM | POA: Diagnosis not present

## 2022-05-16 DIAGNOSIS — G459 Transient cerebral ischemic attack, unspecified: Secondary | ICD-10-CM | POA: Diagnosis not present

## 2022-05-16 DIAGNOSIS — E1122 Type 2 diabetes mellitus with diabetic chronic kidney disease: Secondary | ICD-10-CM | POA: Diagnosis not present

## 2022-05-16 DIAGNOSIS — N1831 Chronic kidney disease, stage 3a: Secondary | ICD-10-CM | POA: Diagnosis not present

## 2022-05-16 DIAGNOSIS — E7800 Pure hypercholesterolemia, unspecified: Secondary | ICD-10-CM | POA: Diagnosis not present

## 2022-05-16 DIAGNOSIS — I10 Essential (primary) hypertension: Secondary | ICD-10-CM | POA: Diagnosis not present

## 2022-05-19 DIAGNOSIS — M546 Pain in thoracic spine: Secondary | ICD-10-CM | POA: Diagnosis not present

## 2022-05-19 DIAGNOSIS — Z23 Encounter for immunization: Secondary | ICD-10-CM | POA: Diagnosis not present

## 2022-06-01 DIAGNOSIS — H02834 Dermatochalasis of left upper eyelid: Secondary | ICD-10-CM | POA: Diagnosis not present

## 2022-06-01 DIAGNOSIS — H02831 Dermatochalasis of right upper eyelid: Secondary | ICD-10-CM | POA: Diagnosis not present

## 2022-06-27 ENCOUNTER — Ambulatory Visit: Admit: 2022-06-27 | Payer: Medicare HMO

## 2022-06-27 SURGERY — COLONOSCOPY WITH PROPOFOL
Anesthesia: General

## 2022-07-11 DIAGNOSIS — N1831 Chronic kidney disease, stage 3a: Secondary | ICD-10-CM | POA: Diagnosis not present

## 2022-07-11 DIAGNOSIS — E7800 Pure hypercholesterolemia, unspecified: Secondary | ICD-10-CM | POA: Diagnosis not present

## 2022-07-11 DIAGNOSIS — Z79899 Other long term (current) drug therapy: Secondary | ICD-10-CM | POA: Diagnosis not present

## 2022-07-11 DIAGNOSIS — E1122 Type 2 diabetes mellitus with diabetic chronic kidney disease: Secondary | ICD-10-CM | POA: Diagnosis not present

## 2022-07-22 DIAGNOSIS — I129 Hypertensive chronic kidney disease with stage 1 through stage 4 chronic kidney disease, or unspecified chronic kidney disease: Secondary | ICD-10-CM | POA: Diagnosis not present

## 2022-07-22 DIAGNOSIS — E1122 Type 2 diabetes mellitus with diabetic chronic kidney disease: Secondary | ICD-10-CM | POA: Diagnosis not present

## 2022-07-22 DIAGNOSIS — Z79899 Other long term (current) drug therapy: Secondary | ICD-10-CM | POA: Diagnosis not present

## 2022-07-22 DIAGNOSIS — N189 Chronic kidney disease, unspecified: Secondary | ICD-10-CM | POA: Diagnosis not present

## 2022-07-22 DIAGNOSIS — Z125 Encounter for screening for malignant neoplasm of prostate: Secondary | ICD-10-CM | POA: Diagnosis not present

## 2022-07-22 DIAGNOSIS — I2510 Atherosclerotic heart disease of native coronary artery without angina pectoris: Secondary | ICD-10-CM | POA: Diagnosis not present

## 2022-07-22 DIAGNOSIS — E785 Hyperlipidemia, unspecified: Secondary | ICD-10-CM | POA: Diagnosis not present

## 2022-11-14 DIAGNOSIS — E7800 Pure hypercholesterolemia, unspecified: Secondary | ICD-10-CM | POA: Diagnosis not present

## 2022-11-14 DIAGNOSIS — I10 Essential (primary) hypertension: Secondary | ICD-10-CM | POA: Diagnosis not present

## 2022-11-14 DIAGNOSIS — I2510 Atherosclerotic heart disease of native coronary artery without angina pectoris: Secondary | ICD-10-CM | POA: Diagnosis not present

## 2022-11-14 DIAGNOSIS — N1831 Chronic kidney disease, stage 3a: Secondary | ICD-10-CM | POA: Diagnosis not present

## 2022-11-14 DIAGNOSIS — E1122 Type 2 diabetes mellitus with diabetic chronic kidney disease: Secondary | ICD-10-CM | POA: Diagnosis not present

## 2022-11-14 DIAGNOSIS — G459 Transient cerebral ischemic attack, unspecified: Secondary | ICD-10-CM | POA: Diagnosis not present

## 2023-01-18 DIAGNOSIS — N1831 Chronic kidney disease, stage 3a: Secondary | ICD-10-CM | POA: Diagnosis not present

## 2023-01-18 DIAGNOSIS — Z79899 Other long term (current) drug therapy: Secondary | ICD-10-CM | POA: Diagnosis not present

## 2023-01-18 DIAGNOSIS — Z125 Encounter for screening for malignant neoplasm of prostate: Secondary | ICD-10-CM | POA: Diagnosis not present

## 2023-01-18 DIAGNOSIS — E1122 Type 2 diabetes mellitus with diabetic chronic kidney disease: Secondary | ICD-10-CM | POA: Diagnosis not present

## 2023-01-18 DIAGNOSIS — E7800 Pure hypercholesterolemia, unspecified: Secondary | ICD-10-CM | POA: Diagnosis not present

## 2023-01-24 DIAGNOSIS — Z1331 Encounter for screening for depression: Secondary | ICD-10-CM | POA: Diagnosis not present

## 2023-01-24 DIAGNOSIS — Z0000 Encounter for general adult medical examination without abnormal findings: Secondary | ICD-10-CM | POA: Diagnosis not present

## 2023-06-05 DIAGNOSIS — E1122 Type 2 diabetes mellitus with diabetic chronic kidney disease: Secondary | ICD-10-CM | POA: Diagnosis not present

## 2023-06-05 DIAGNOSIS — I255 Ischemic cardiomyopathy: Secondary | ICD-10-CM | POA: Diagnosis not present

## 2023-06-05 DIAGNOSIS — I2510 Atherosclerotic heart disease of native coronary artery without angina pectoris: Secondary | ICD-10-CM | POA: Diagnosis not present

## 2023-06-05 DIAGNOSIS — E7800 Pure hypercholesterolemia, unspecified: Secondary | ICD-10-CM | POA: Diagnosis not present

## 2023-06-05 DIAGNOSIS — I10 Essential (primary) hypertension: Secondary | ICD-10-CM | POA: Diagnosis not present

## 2023-06-05 DIAGNOSIS — G459 Transient cerebral ischemic attack, unspecified: Secondary | ICD-10-CM | POA: Diagnosis not present

## 2023-06-05 DIAGNOSIS — N1831 Chronic kidney disease, stage 3a: Secondary | ICD-10-CM | POA: Diagnosis not present

## 2023-07-05 DIAGNOSIS — Z23 Encounter for immunization: Secondary | ICD-10-CM | POA: Diagnosis not present

## 2023-07-18 DIAGNOSIS — N1831 Chronic kidney disease, stage 3a: Secondary | ICD-10-CM | POA: Diagnosis not present

## 2023-07-18 DIAGNOSIS — E1122 Type 2 diabetes mellitus with diabetic chronic kidney disease: Secondary | ICD-10-CM | POA: Diagnosis not present

## 2023-07-18 DIAGNOSIS — E7800 Pure hypercholesterolemia, unspecified: Secondary | ICD-10-CM | POA: Diagnosis not present

## 2023-07-25 DIAGNOSIS — Z79899 Other long term (current) drug therapy: Secondary | ICD-10-CM | POA: Diagnosis not present

## 2023-07-25 DIAGNOSIS — E1122 Type 2 diabetes mellitus with diabetic chronic kidney disease: Secondary | ICD-10-CM | POA: Diagnosis not present

## 2023-07-25 DIAGNOSIS — N189 Chronic kidney disease, unspecified: Secondary | ICD-10-CM | POA: Diagnosis not present

## 2023-07-25 DIAGNOSIS — Z125 Encounter for screening for malignant neoplasm of prostate: Secondary | ICD-10-CM | POA: Diagnosis not present

## 2023-07-25 DIAGNOSIS — I129 Hypertensive chronic kidney disease with stage 1 through stage 4 chronic kidney disease, or unspecified chronic kidney disease: Secondary | ICD-10-CM | POA: Diagnosis not present

## 2023-07-25 DIAGNOSIS — E785 Hyperlipidemia, unspecified: Secondary | ICD-10-CM | POA: Diagnosis not present

## 2023-12-05 DIAGNOSIS — E1122 Type 2 diabetes mellitus with diabetic chronic kidney disease: Secondary | ICD-10-CM | POA: Diagnosis not present

## 2023-12-05 DIAGNOSIS — I255 Ischemic cardiomyopathy: Secondary | ICD-10-CM | POA: Diagnosis not present

## 2023-12-05 DIAGNOSIS — N1831 Chronic kidney disease, stage 3a: Secondary | ICD-10-CM | POA: Diagnosis not present

## 2023-12-05 DIAGNOSIS — G459 Transient cerebral ischemic attack, unspecified: Secondary | ICD-10-CM | POA: Diagnosis not present

## 2023-12-05 DIAGNOSIS — I2510 Atherosclerotic heart disease of native coronary artery without angina pectoris: Secondary | ICD-10-CM | POA: Diagnosis not present

## 2023-12-05 DIAGNOSIS — E7800 Pure hypercholesterolemia, unspecified: Secondary | ICD-10-CM | POA: Diagnosis not present

## 2023-12-05 DIAGNOSIS — I10 Essential (primary) hypertension: Secondary | ICD-10-CM | POA: Diagnosis not present

## 2024-01-18 DIAGNOSIS — Z125 Encounter for screening for malignant neoplasm of prostate: Secondary | ICD-10-CM | POA: Diagnosis not present

## 2024-01-18 DIAGNOSIS — E7800 Pure hypercholesterolemia, unspecified: Secondary | ICD-10-CM | POA: Diagnosis not present

## 2024-01-18 DIAGNOSIS — N1831 Chronic kidney disease, stage 3a: Secondary | ICD-10-CM | POA: Diagnosis not present

## 2024-01-18 DIAGNOSIS — Z79899 Other long term (current) drug therapy: Secondary | ICD-10-CM | POA: Diagnosis not present

## 2024-01-18 DIAGNOSIS — E1122 Type 2 diabetes mellitus with diabetic chronic kidney disease: Secondary | ICD-10-CM | POA: Diagnosis not present

## 2024-01-25 DIAGNOSIS — I10 Essential (primary) hypertension: Secondary | ICD-10-CM | POA: Diagnosis not present

## 2024-01-25 DIAGNOSIS — I2510 Atherosclerotic heart disease of native coronary artery without angina pectoris: Secondary | ICD-10-CM | POA: Diagnosis not present

## 2024-01-25 DIAGNOSIS — N1831 Chronic kidney disease, stage 3a: Secondary | ICD-10-CM | POA: Diagnosis not present

## 2024-01-25 DIAGNOSIS — Z79899 Other long term (current) drug therapy: Secondary | ICD-10-CM | POA: Diagnosis not present

## 2024-01-25 DIAGNOSIS — Z0000 Encounter for general adult medical examination without abnormal findings: Secondary | ICD-10-CM | POA: Diagnosis not present

## 2024-01-25 DIAGNOSIS — E7800 Pure hypercholesterolemia, unspecified: Secondary | ICD-10-CM | POA: Diagnosis not present

## 2024-01-25 DIAGNOSIS — E1122 Type 2 diabetes mellitus with diabetic chronic kidney disease: Secondary | ICD-10-CM | POA: Diagnosis not present

## 2024-01-25 DIAGNOSIS — Z1331 Encounter for screening for depression: Secondary | ICD-10-CM | POA: Diagnosis not present

## 2024-06-11 DIAGNOSIS — I255 Ischemic cardiomyopathy: Secondary | ICD-10-CM | POA: Diagnosis not present

## 2024-06-11 DIAGNOSIS — I10 Essential (primary) hypertension: Secondary | ICD-10-CM | POA: Diagnosis not present

## 2024-06-11 DIAGNOSIS — G459 Transient cerebral ischemic attack, unspecified: Secondary | ICD-10-CM | POA: Diagnosis not present

## 2024-06-11 DIAGNOSIS — N1831 Chronic kidney disease, stage 3a: Secondary | ICD-10-CM | POA: Diagnosis not present

## 2024-06-11 DIAGNOSIS — I2510 Atherosclerotic heart disease of native coronary artery without angina pectoris: Secondary | ICD-10-CM | POA: Diagnosis not present

## 2024-06-11 DIAGNOSIS — E1122 Type 2 diabetes mellitus with diabetic chronic kidney disease: Secondary | ICD-10-CM | POA: Diagnosis not present

## 2024-06-12 DIAGNOSIS — Z23 Encounter for immunization: Secondary | ICD-10-CM | POA: Diagnosis not present
